# Patient Record
Sex: Female | Born: 1960 | Race: Black or African American | Hispanic: No | Marital: Single | Smoking: Current some day smoker
Health system: Southern US, Community
[De-identification: ages and names within clinical notes are randomized; demographics above are authoritative.]

---

## 1998-02-28 ENCOUNTER — Other Ambulatory Visit: Admission: RE | Admit: 1998-02-28 | Discharge: 1998-02-28 | Payer: Self-pay | Admitting: Internal Medicine

## 1999-02-14 ENCOUNTER — Encounter: Payer: Self-pay | Admitting: Emergency Medicine

## 1999-02-14 ENCOUNTER — Emergency Department (HOSPITAL_COMMUNITY): Admission: EM | Admit: 1999-02-14 | Discharge: 1999-02-14 | Payer: Self-pay | Admitting: Emergency Medicine

## 1999-04-19 ENCOUNTER — Other Ambulatory Visit: Admission: RE | Admit: 1999-04-19 | Discharge: 1999-04-19 | Payer: Self-pay | Admitting: Internal Medicine

## 2000-03-31 ENCOUNTER — Encounter: Admission: RE | Admit: 2000-03-31 | Discharge: 2000-06-29 | Payer: Self-pay | Admitting: Internal Medicine

## 2001-06-06 ENCOUNTER — Inpatient Hospital Stay (HOSPITAL_COMMUNITY): Admission: AD | Admit: 2001-06-06 | Discharge: 2001-06-10 | Payer: Self-pay | Admitting: *Deleted

## 2002-05-04 ENCOUNTER — Emergency Department (HOSPITAL_COMMUNITY): Admission: EM | Admit: 2002-05-04 | Discharge: 2002-05-04 | Payer: Self-pay | Admitting: Emergency Medicine

## 2002-05-04 ENCOUNTER — Encounter: Payer: Self-pay | Admitting: Emergency Medicine

## 2003-10-10 ENCOUNTER — Ambulatory Visit (HOSPITAL_BASED_OUTPATIENT_CLINIC_OR_DEPARTMENT_OTHER): Admission: RE | Admit: 2003-10-10 | Discharge: 2003-10-10 | Payer: Self-pay | Admitting: Family Medicine

## 2004-10-09 ENCOUNTER — Ambulatory Visit (HOSPITAL_COMMUNITY): Admission: RE | Admit: 2004-10-09 | Discharge: 2004-10-09 | Payer: Self-pay | Admitting: Obstetrics

## 2004-11-15 ENCOUNTER — Encounter: Admission: RE | Admit: 2004-11-15 | Discharge: 2004-11-15 | Payer: Self-pay | Admitting: Obstetrics

## 2006-09-08 ENCOUNTER — Inpatient Hospital Stay (HOSPITAL_COMMUNITY): Admission: AD | Admit: 2006-09-08 | Discharge: 2006-09-15 | Payer: Self-pay | Admitting: *Deleted

## 2006-09-08 ENCOUNTER — Ambulatory Visit: Payer: Self-pay | Admitting: *Deleted

## 2006-12-31 ENCOUNTER — Ambulatory Visit (HOSPITAL_COMMUNITY): Admission: RE | Admit: 2006-12-31 | Discharge: 2006-12-31 | Payer: Self-pay | Admitting: Internal Medicine

## 2007-11-06 ENCOUNTER — Emergency Department (HOSPITAL_COMMUNITY): Admission: EM | Admit: 2007-11-06 | Discharge: 2007-11-07 | Payer: Self-pay | Admitting: Emergency Medicine

## 2008-11-30 ENCOUNTER — Emergency Department (HOSPITAL_COMMUNITY): Admission: EM | Admit: 2008-11-30 | Discharge: 2008-11-30 | Payer: Self-pay | Admitting: Emergency Medicine

## 2009-07-25 ENCOUNTER — Encounter: Admission: RE | Admit: 2009-07-25 | Discharge: 2009-07-25 | Payer: Self-pay | Admitting: Internal Medicine

## 2010-03-04 ENCOUNTER — Emergency Department (HOSPITAL_COMMUNITY): Admission: EM | Admit: 2010-03-04 | Discharge: 2010-03-04 | Payer: Self-pay | Admitting: Emergency Medicine

## 2010-06-14 ENCOUNTER — Encounter: Admission: RE | Admit: 2010-06-14 | Payer: Self-pay | Source: Home / Self Care | Admitting: Internal Medicine

## 2010-07-22 ENCOUNTER — Encounter: Payer: Self-pay | Admitting: Internal Medicine

## 2010-07-22 ENCOUNTER — Encounter: Payer: Self-pay | Admitting: Obstetrics

## 2010-07-31 ENCOUNTER — Other Ambulatory Visit: Payer: Self-pay | Admitting: Internal Medicine

## 2010-07-31 DIAGNOSIS — Z1231 Encounter for screening mammogram for malignant neoplasm of breast: Secondary | ICD-10-CM

## 2010-07-31 DIAGNOSIS — Z1239 Encounter for other screening for malignant neoplasm of breast: Secondary | ICD-10-CM

## 2010-08-04 ENCOUNTER — Encounter: Payer: Self-pay | Admitting: Internal Medicine

## 2010-08-08 ENCOUNTER — Ambulatory Visit
Admission: RE | Admit: 2010-08-08 | Discharge: 2010-08-08 | Disposition: A | Discharge status: Home / Self Care | Payer: Medicaid Other | Source: Ambulatory Visit | Attending: Internal Medicine | Admitting: Internal Medicine

## 2010-08-08 DIAGNOSIS — Z1231 Encounter for screening mammogram for malignant neoplasm of breast: Secondary | ICD-10-CM

## 2010-10-08 LAB — BASIC METABOLIC PANEL
CO2: 31 mEq/L (ref 19–32)
Chloride: 102 mEq/L (ref 96–112)
Creatinine, Ser: 0.56 mg/dL (ref 0.4–1.2)
GFR calc Af Amer: >60 mL/min (ref >60)
Glucose, Bld: 89 mg/dL (ref 70–99)
Sodium: 139 mEq/L (ref 135–145)

## 2010-10-08 LAB — URINALYSIS, ROUTINE W REFLEX MICROSCOPIC
Glucose, UA: NEGATIVE mg/dL
Hgb urine dipstick: NEGATIVE
Specific Gravity, Urine: 1.017 (ref 1.005–1.030)

## 2010-10-08 LAB — POCT CARDIAC MARKERS

## 2010-10-30 ENCOUNTER — Emergency Department (HOSPITAL_COMMUNITY): Payer: Medicaid Other

## 2010-10-30 ENCOUNTER — Emergency Department (HOSPITAL_COMMUNITY)
Admission: EM | Admit: 2010-10-30 | Discharge: 2010-10-30 | Disposition: A | Discharge status: Home / Self Care | Payer: Medicaid Other | Attending: Emergency Medicine | Admitting: Emergency Medicine

## 2010-10-30 DIAGNOSIS — L0201 Cutaneous abscess of face: Secondary | ICD-10-CM | POA: Insufficient documentation

## 2010-10-30 DIAGNOSIS — R599 Enlarged lymph nodes, unspecified: Secondary | ICD-10-CM | POA: Insufficient documentation

## 2010-10-30 DIAGNOSIS — E119 Type 2 diabetes mellitus without complications: Secondary | ICD-10-CM | POA: Insufficient documentation

## 2010-10-30 DIAGNOSIS — R221 Localized swelling, mass and lump, neck: Secondary | ICD-10-CM | POA: Insufficient documentation

## 2010-10-30 DIAGNOSIS — R220 Localized swelling, mass and lump, head: Secondary | ICD-10-CM | POA: Insufficient documentation

## 2010-10-30 DIAGNOSIS — I10 Essential (primary) hypertension: Secondary | ICD-10-CM | POA: Insufficient documentation

## 2010-10-30 DIAGNOSIS — L03211 Cellulitis of face: Secondary | ICD-10-CM | POA: Insufficient documentation

## 2010-10-30 LAB — DIFFERENTIAL
Lymphs Abs: 2 10*3/uL (ref 0.7–4.0)
Monocytes Relative: 12 % (ref 3–12)
Neutro Abs: 6.1 10*3/uL (ref 1.7–7.7)
Neutrophils Relative %: 65 % (ref 43–77)

## 2010-10-30 LAB — POCT I-STAT, CHEM 8
BUN: 7 mg/dL (ref 6–23)
Calcium, Ion: 1.19 mmol/L (ref 1.12–1.32)
Creatinine, Ser: 0.9 mg/dL (ref 0.4–1.2)
Glucose, Bld: 105 mg/dL — ABNORMAL HIGH (ref 70–99)
TCO2: 34 mmol/L (ref 0–100)

## 2010-10-30 LAB — CBC
HCT: 40.5 % (ref 36.0–46.0)
Hemoglobin: 12.3 g/dL (ref 12.0–15.0)
MCH: 24.4 pg — ABNORMAL LOW (ref 26.0–34.0)
MCV: 80.2 fL (ref 78.0–100.0)
RBC: 5.05 MIL/uL (ref 3.87–5.11)

## 2010-10-30 MED ORDER — IOHEXOL 300 MG/ML  SOLN
100.0000 mL | Freq: Once | INTRAMUSCULAR | Status: AC | PRN
  Administered 2010-10-30: 100 mL via INTRAVENOUS

## 2010-11-16 NOTE — Discharge Summary (Signed)
[facility]  Patient:    Ellen Acevedo, Ellen Acevedo Visit Number: [id number] MRN: [id number]          Service Type: PSY Location: 300 [id number] 02 Attending Physician:  Jeanice Lim Dictated by:   Jeanice Lim, M.D. Admit Date:  06/06/2001 Discharge Date: 06/10/2001                             Discharge Summary  IDENTIFYING DATA:  This is a 50 year old single African-American female involuntarily admitted for psychosis.  The patient has been hospitalized at [facility] in the past and sees Dr. Dennie Maizes at [facility]  ADMISSION MEDICATIONS:  Maxzide, Risperdal, Effexor.  ALLERGIES:  TRILAFON.  PHYSICAL EXAMINATION:  Patient is 201 pounds, otherwise essentially within normal limits.   Neurologically nonfocal.  ROUTINE ADMISSION LABS:  Within normal limits, revealed a CBC with difficulty and comprehensive metabolic panel within normal limits, except for a low potassium at 2.6 on December 8, corrected at 3.2 on December 9, corrected to 3.5 on December 10.  MENTAL STATUS EXAMINATION:  Alert, middle-aged, overweight female, casually dressed, cooperative, fair eye contact.  Speech within normal limits.  Mood mildly depressed, affect flat.  Thought process goal directed.  Thought content positive for auditory hallucinations and paranoia.  No suicidal or homicidal ideation.  Cognitively intact.  Judgment and insight fair.  ADMISSION DIAGNOSES: Axis I:    Paranoid schizophrenia. Axis II:   None. Axis III:  Hypertension, borderline diabetes. Axis IV:   Problems with primary support, moderate. Axis V:    30/60.  HOSPITAL COURSE:  The patient was admitted with routine p.r.n. medications and Maxzide, Risperdal were resumed and Ativan t.i.d. p.r.n. anxiety, and Effexor XR 37.5 mg q.d. were started to target anxiety and depression.  Maxzide was adjusted, potassium was replaced with K-Dur until within normal limits. Abilify was  started at 10 mg and Effexor XR optimized.  A family meeting was held and patient reported good tolerance to medications as well as a positive response.  CONDITION ON DISCHARGE:  Improved.  Mood more stable. Affect brighter, thought processes goal directed.  Thought content negative for psychotic symptoms and dangerous ideations.  The patient reported motivation to be compliant with followup.  DISCHARGE MEDICATIONS: 1. Risperdal 2 mg q.h.s. 2. Maxzide 37.5/25 mg qam 3. K-Dur 20 mEq q.a.m. 4. Abilify 15 mg q.h.s. 5. Effexor XR 37.5 mg q.a.m. and q.3p.  DISPOSITION:  The patient had a followup appointment with [facility] on December 13 at 10 a.m.  DISCHARGE DIAGNOSES: Axis I:    Paranoid schizophrenia. Axis II:   None. Axis III:  Hypertension, borderline diabetes. Axis IV:   Problems with primary support, moderate. Axis V:    Global assessment of function on discharge was 50. Dictated by:   Jeanice Lim, M.D. Attending Physician:  Jeanice Lim DD:  07/16/01 TD:  07/16/01 Job: [id number]

## 2010-11-16 NOTE — Discharge Summary (Signed)
NAME:  Ellen Acevedo, Ellen Acevedo NO.:  [id number]   MEDICAL RECORD NO.:  [id number]          PATIENT TYPE:  IPS   LOCATION:  0403                          [facility]ILITY:  [facility]   PHYSICIAN:  Jasmine Pang, M.D. DATE OF BIRTH:  1961/04/13   DATE OF ADMISSION:  09/08/2006  DATE OF DISCHARGE:  09/15/2006                               DISCHARGE SUMMARY   IDENTIFYING INFORMATION:  A 50 year old African American female who was  admitted on an involuntary basis on September 08, 2006.   HISTORY OF PRESENT ILLNESS:  The patient was referred for admission  after developing agitation and disorganized behavior.  She reportedly  smoked a carton of cigarettes within the past week which is unlike her.  She has been tearful and believes some friend of hers has disappeared  into thin air in front of her.  She believes another friend of hers  was murdered (there was no indication this had really happened).  She  had stopped all her medications about 3 weeks ago.  She says the  minister told her she did not need them.  The [facility].  She was treated at a  group home for a period of time.  This is one of several psych  admissions.  This is the first Ellen Acevedo admission.  She was diagnosed by Dr.  Waymon Acevedo at the age of 54 with nerve problems.  She had a history of  family problems.  There was no substance abuse.  The patient has  diabetes mellitus.  She is on multiple medication (see hospital course).   SHE IS ALLERGIC POSSIBLY TO TRILAFON BY REPORT.   PHYSICAL FINDINGS:  The patient's physical exam revealed no acute  medical problems.   ADMISSION LABORATORIES:  Hemogram was within normal limits.  A routine  chemistry profile was remarkable for slightly decreased sodium of 132  and a decreased potassium of 2.8 (3.5 to 5.1).  BUN was low at 5.  A  repeat potassium done September 10, 2006 was 3.3.  Hepatic profile was  within normal limits.  TSH  was 1.003 which was within normal limits.   HOSPITAL COURSE:  Upon admission the patient was continued on her home  medications of albuterol inhaler two puffs q.6 hours p.r.n. shortness of  breath; Risperdal M-Tab 1 mg p.o. at bedtime; Claritin 10 mg p.o. daily  p.r.n.; Risperdal M-Tab 0.5 mg p.o. q.6 hours p.r.n.; Ativan 1 mg p.o.  q.6 hours p.r.n.  She was also restarted on her simvastatin 40 mg daily;  thiothixene 10 mg p.o. at bedtime; Effexor XR 37.5 mg daily x3 days then  75 mg daily; K-Dur 40 mEq now with a repeat potassium level which was up  to 3.3; aspirin 325 mg daily; Seroquel 100 mg now, then 50 mg p.o. q.  noon and 100 mg p.o. q. 6 hours p.r.n. agitation; Risperdal M-Tab 1 mg  p.o. now; Risperdal M-Tab 2 mg p.o. at bedtime; enalapril 10 mg p.o.  daily; Topamax 25 mg p.o. b.i.d.; triamterene hydrochlorothiazide  37.5/25 daily;  multivitamin daily; Klor-Con 20 mEq daily.  The patient  was also started on trazodone 50 mg p.o. at bedtime p.r.n.   On September 09, 2006, the patient was treated with Nasonex 2 sprays at  bedtime x3 days and then 1 spray each nostril at bedtime until finished.  On September 10, 2006, her Navane was increased to 5 mg p.o. q.a.m. and 10  mg p.o. at bedtime.  Effexor was increased to 75 mg p.o. daily.  On  September 11, 2006 Risperdal was increased to 3 mg p.o. at bedtime.  On  September 12, 2006 Effexor XR was increased to 112.5 mg daily.  The patient  tolerated her medications well with no significant side effects.   Upon first meeting the patient she stated I am normal now.  She states  she is Ellen Acevedo and not Ellen Acevedo.  She told me her sister had been tampering  with her medications.  She wants a Programmer, multimedia to help her,  she is gone  to [place] for me before.  She states her preacher told her to stop  taking her medications.  The patient was disoriented and confused and  had to be placed on one-to-one monitoring.  On September 12, 2006 the  patient stated she felt  better.  She had good eye contact.  She felt she  could stay with family after I offered assisted living.  She slept well.  Appetite was good.  She denied depression.  She still talks about two  Ellen Acevedo.  There was some question of disorganized thinking.  She also  talked about two Ellen Acevedo.  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  She seemed slightly more organized and lucid.  Risperdal 3 mg p.o. at  bedtime was continued as well as Navane 5 mg q.a.m. and 10 mg at  bedtime.  Effexor dose was increased to 112.5 mg daily.  On September 13, 2006 the patient was feeling better.  She stated she wanted to go to  for summer school.  Her appetite was good.  She denied suicidal  ideation.  She was having some positive auditory hallucinations, distant  sounds like conversations.  On September 14, 2006 the patient was feeling  better.  She reports she had no suicidal ideation and no hallucinations.  She had good eye contact was and was pleasant.   On September 15, 2006 mental status had improved.  The patient was friendly  with good eye contact.  She was conversant.  Speech was normal rate and  flow.  She was cooperative.  Psychomotor activity was within normal  limits.  Mood was euthymic.  Affect wide range.  No suicidal or  homicidal ideation.  No thoughts of self injurious behavior.  No  auditory or visual hallucinations.  No paranoia or delusions.  Thoughts  were logical and goal-directed.  Thought content, no predominant theme.  Cognitive was grossly back to baseline.  It was felt the patient was  well enough to be discharged today.   DISCHARGE DIAGNOSES:  AXIS I:  Schizophrenia, disorganized type.  AXIS II:  No diagnosis.  AXIS III:  Hypertension; diabetes mellitus type 2 controlled with diet;  upper respiratory infection/rhinosinusitis; dyslipidemia; obesity.  AXIS IV:  Moderate  (problems with primary support group; medical  problems; burden of psychiatric illness). AXIS V:  GAF upon  discharge was 50; GAF upon admission was 32; GAF  highest past year was 58-59.   DISCHARGE PLAN:  There were no specific activity level  or dietary  restrictions.   POST HOSPITAL CARE PLANS:  The patient will be seen at the [facility] by Dr. __________ on Tuesday March 25 at 10:30 a.m.   DISCHARGE MEDICATIONS:  1. Zocor 40 mg at 6 p.m.  2. Aspirin 325 mg daily.  3. Vasotec 10 mg daily.  4. Topamax 25 mg twice daily.  5. Maxzide 37.5/25 mg tablets, one pill in the a.m.  6. Multivitamins with minerals.  7. Potassium chloride 20 mEq daily.  8. Nasonex one spray in each nostril at bedtime.  9. Seroquel 50 mg at noon.  10.Navane 5 mg in the a.m. and 10 mg at bedtime.  11.Effexor XR 112.5 mg daily.  12.Trazodone 50 mg at bedtime if needed for insomnia.  13.Risperdal 3 mg at bedtime.      Jasmine Pang, M.D.  Electronically Signed     BHS/MEDQ  D:  10/14/2006  T:  10/14/2006  Job:  [id number]

## 2010-11-16 NOTE — H&P (Signed)
[facility]  Patient:    Ellen Acevedo, Ellen Acevedo Visit Number: [id number] MRN: [id number]          Service Type: PSY Location: [id number] 01 Attending Physician:  Denny Peon Dictated by:   Candi Leash. Orsini, N.P. Admit Date:  06/06/2001                     Psychiatric Admission Assessment  DATE OF ADMISSION:  June 11, 2001  PATIENT IDENTIFICATION:  This is a 50 year old single African-American female involuntarily admitted on June 06, 2001, for psychosis.  HISTORY OF PRESENT ILLNESS:  The patient presents with a history of psychosis. The patient was having auditory hallucinations and threatening voices and "hearing conversations about hurting her pastor and nephew."  The patient reports that she had argued with her sister over the rent yesterday.  The police were called.  Reports the third time this year and is unsure why this prompted this admission.  The patient reports she stopped taking her Effexor, that she wanted God to help her instead.  Experiencing positive olfactory hallucinations smelling fish and tomatoes.  Reports some depressive symptoms but denies any current suicidal or homicidal ideations.  The patient is unsure about threats that were mentioned toward her nephew.  She has been sleeping poorly.  Her appetite has been satisfactory.  She has had a 50 pound weight loss this year that was intentional to control her diabetes.  The patient contracts for safety.  PAST PSYCHIATRIC HISTORY:  First visit to [facility].  She was at [facility] in 1991.  Sees Dr. Octavio Acevedo at [facility], last visit was late November.  SUBSTANCE ABUSE HISTORY:  The patient smokes a half a pack a day.  She drinks alcohol on a rare basis.  Denies any substance use.  PAST MEDICAL HISTORY:  Primary care Ellen Acevedo: Dr. Delanna Acevedo in [place]. Medical problems: Hypertension, borderline diabetes.  MEDICATIONS: 1.  Maxzide 25 mg. 2. Risperdal 2 mg in the morning and 2 mg at night. 3. Effexor for one month.  DRUG ALLERGIES:  TRILAFON.  REVIEW OF SYSTEMS:  The patient denies any fever or chills.  Has had a 50 pound weight loss with no recent change in appetite.  No blurred or double vision.  No hearing loss or sinus pain.  No chest pain or palpitations. The patient smokes but no cough or orthopnea.  GI: No heartburn or change in constipation or diarrhea.  No dysuria, frequency, or hematuria.  No stiffness, swelling, or joint pain.  No itching, wound, or rash.  No numbness, tingling, or weakness.  Some history of depression and hallucinations.  No thyroid or diabetic problems.  No enlarged or tender nodes.  No environmental allergies.  PHYSICAL EXAMINATION:  VITAL SIGNS:  The patient is 201 pounds.  She is 5 feet 4.5 inches tall. Temperature 98.7, pulse 75, respirations 20, blood pressure 136/94.  GENERAL:  The patient is a 50 year old African-American female in no acute distress, well-nourished, appears her stated age.  She is fairly groomed, alert, and cooperative.  HEENT:  Head is normocephalic.  She can raise her eyebrows.  Hair is evenly distributed.  EOMs are intact bilaterally.  External ear canals are patent. Hearing is appropriate to conversation.  No sinus tenderness, no nasal discharge.  Mucosa is moist with fair dentition, no lesions were seen.  Tongue protrudes to midline without tremor.  She can clench her teeth and puff out her cheeks.  NECK:  Supple, no JVD, negative lymphadenopathy.  Thyroid is nonpalpable and nontender.  Trachea is midline.  CHEST:  Clear to auscultation, no adventitious sounds, no cough.  CARDIOVASCULAR:  Heart is regular rate and rhythm without murmurs, rubs, or gallops.  Carotid pulses are equal and adequate.  BREAST:  Exam was deferred.  ABDOMEN:  Soft, nontender abdomen, no CVA tenderness.  MUSCULOSKELETAL:  No joint swelling or deformity.  Good  range of motion. Muscle strength and tone is equal bilaterally.  SKIN:  Warm and dry with good turgor.  Nail beds are pink.  NEUROLOGIC:  Oriented x 3.  Cranial nerves are grossly intact.  Good grip strength bilaterally.  No involuntary movements.  Gait is normal.  Cerebellar function is intact with heel-to-shin and normal alternating movements. Romberg is negative.  SOCIAL HISTORY:  She is a 50 year old single African-American female with no children.  She lives with her sister, Ellen Acevedo.  She is on disability for her psychiatric illness.  Completed three and a half years of college.  She is intending to live with her other sister, Ellen Acevedo.  The patient reports no legal problems.  FAMILY HISTORY:  None.  MENTAL STATUS EXAMINATION:  She is an alert, middle-aged, overweight female, casually dressed and cooperative, fair eye contact.  Speech is normal and relevant.  Mood: The patient is mildly depressed.  Affect is flat.  Thought processes: Positive auditory and positive olfactory hallucinations, positive paranoia, no suicidal or homicidal ideations.  Cognitive: Oriented x 4. Memory is fair.  Judgment is fair.  Insight is poor to fair.  ADMISSION DIAGNOSES: Axis I:    1. Paranoid schizophrenia.            2. Rule out depression with psychotic features. Axis II:   Deferred. Axis III:  1. Hypertension.            2. Borderline diabetes. Axis IV:   Problems with primary support group, housing, and other            psychosocial problems. Axis V:    Current is 30, estimated this past year is 60.  INITIAL PLAN OF CARE:  Plan is an involuntary admission to [facility] for psychosis.  Contract for safety.  Check every 15 minutes.  The patient will remain on the locked hall for close monitoring.  We will resume her routine medications.  Will check her CBGs and hemoglobin A1c.  Will contact her family for collateral information.   Goal is to stabilize mood and thinking so the  patient can be safe and functional, to be medication  compliant, and to follow up with Dr. Octavio Acevedo after discharge.  ESTIMATED LENGTH OF STAY:  Three to five days. Dictated by:   Candi Leash. Orsini, N.P. Attending Physician:  Denny Peon DD:  06/09/01 TD:  06/09/01 Job: [id number]

## 2011-02-08 ENCOUNTER — Ambulatory Visit (HOSPITAL_BASED_OUTPATIENT_CLINIC_OR_DEPARTMENT_OTHER): Payer: Medicaid Other

## 2011-03-27 LAB — CBC
HCT: 49.5 — ABNORMAL HIGH
MCV: 86.5
Platelets: 264
WBC: 9.8

## 2011-03-27 LAB — COMPREHENSIVE METABOLIC PANEL
Albumin: 3.9
BUN: 3 — ABNORMAL LOW
Chloride: 101
Creatinine, Ser: 0.7
GFR calc non Af Amer: >60
Total Bilirubin: 0.9

## 2011-03-27 LAB — URINALYSIS, ROUTINE W REFLEX MICROSCOPIC
Bilirubin Urine: NEGATIVE
Glucose, UA: NEGATIVE
Hgb urine dipstick: NEGATIVE
Ketones, ur: 15 — AB
pH: 7.5

## 2011-03-27 LAB — DIFFERENTIAL
Basophils Absolute: 0.1
Lymphocytes Relative: 28
Monocytes Relative: 7
Neutro Abs: 6.2

## 2011-03-27 LAB — RAPID URINE DRUG SCREEN, HOSP PERFORMED
Barbiturates: NOT DETECTED
Cocaine: NOT DETECTED
Opiates: NOT DETECTED

## 2011-10-17 ENCOUNTER — Other Ambulatory Visit: Payer: Self-pay | Admitting: Internal Medicine

## 2011-10-17 DIAGNOSIS — Z1231 Encounter for screening mammogram for malignant neoplasm of breast: Secondary | ICD-10-CM

## 2011-11-20 ENCOUNTER — Ambulatory Visit: Payer: Medicaid Other

## 2011-12-20 IMAGING — CT CT NECK W/ CM
3 series · 13 of 20 positions shown, 15 images · IV contrast (agent unspecified)
Comparison: None.

CLINICAL DATA: 49-year-old female with possible abscess under Chan.
Pain and swelling.

CT NECK WITH CONTRAST
TECHNIQUE: Multidetector CT imaging of the neck was performed with
intravenous contrast.
Contrast: 100 ml [id number].

[Series 2: neck rtn st · axial · 0.39mm/px · z∈[-199,-55]mm · 5 of 73 slices shown]
[im 13/73  bone]
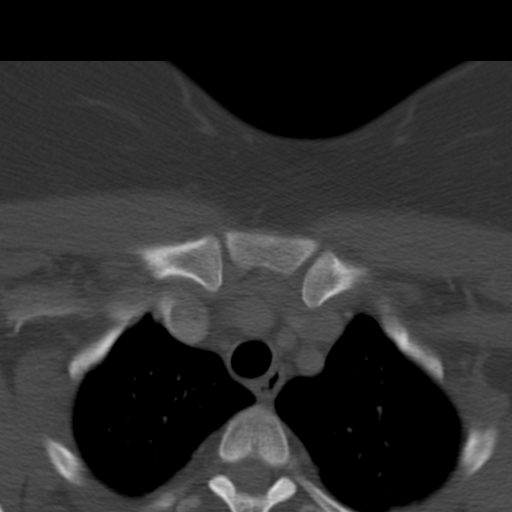
[im 25/73  bone]
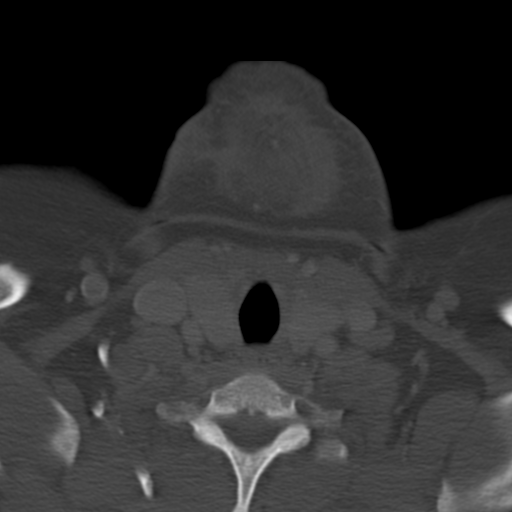
[im 37/73  bone]
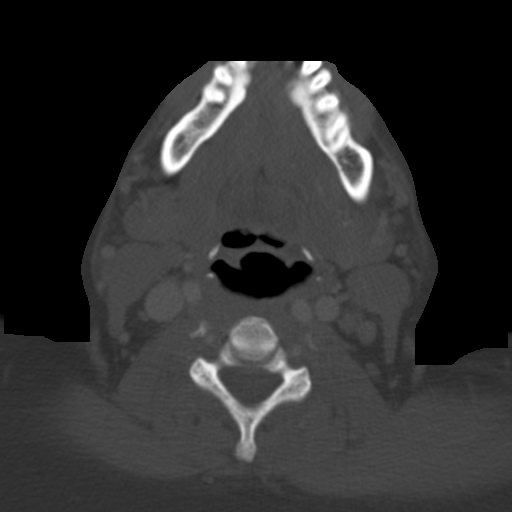
[im 49/73  bone]
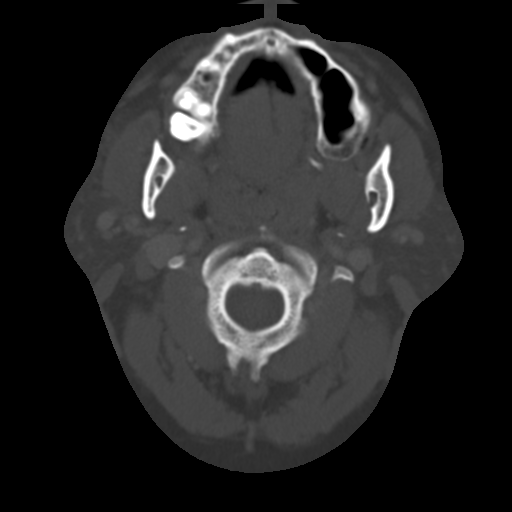
[im 61/73  bone]
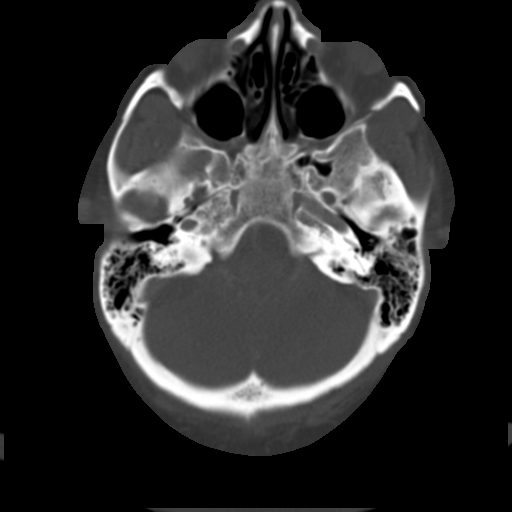

[Series 602: <mpr thick range> · coronal · 0.43mm/px · 3 of 58 slices shown]
[im 19/58  bone]
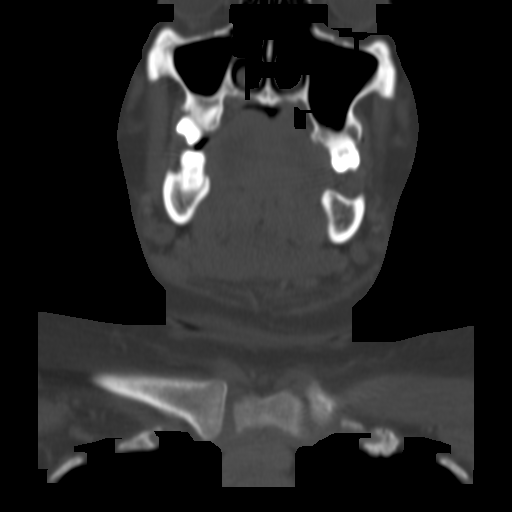
[im 26/58  bone]
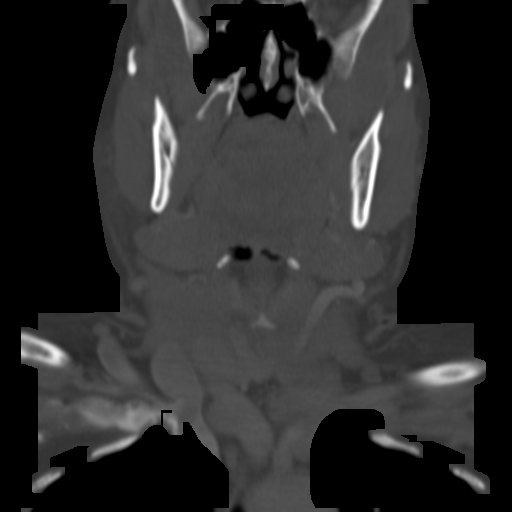
[im 33/58  bone]
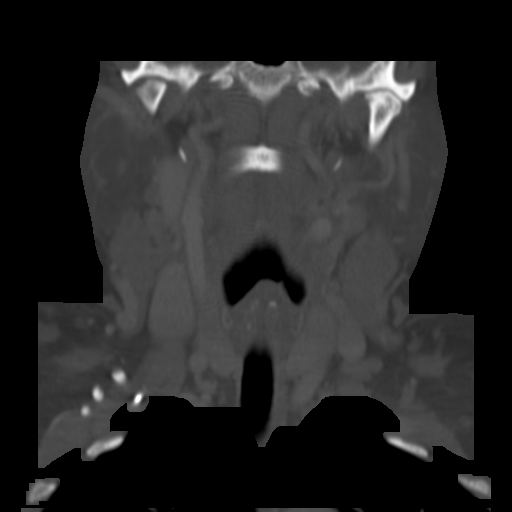

[Series 603: <mpr thick range(1)> · axial · 0.43mm/px · z∈[-261,-134]mm · 5 of 72 slices shown, 7 images]
[im 12/72  soft-tissue]
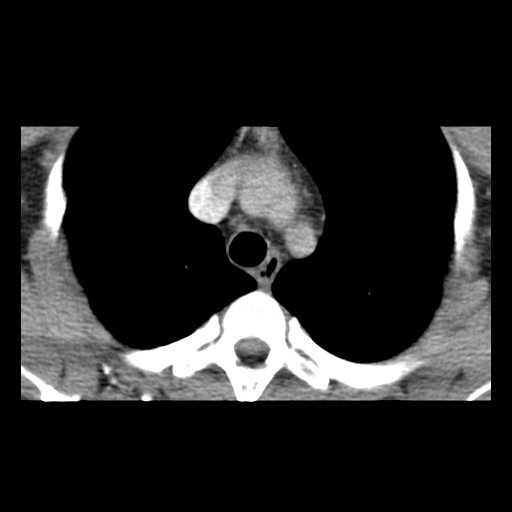
[im 12/72  bone]
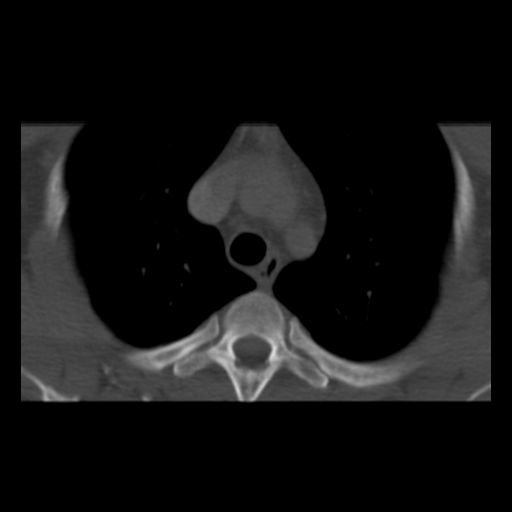
[im 24/72  bone]
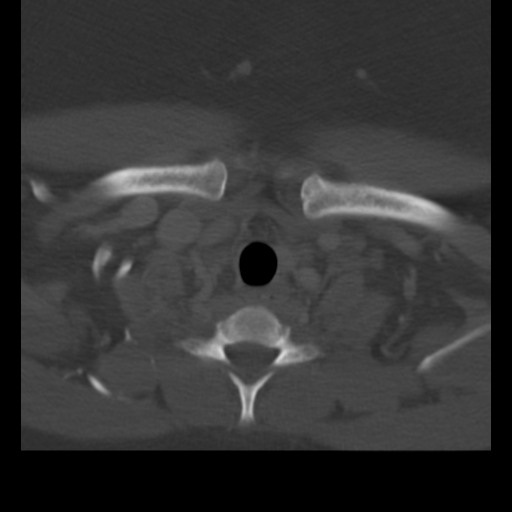
[im 36/72  bone]
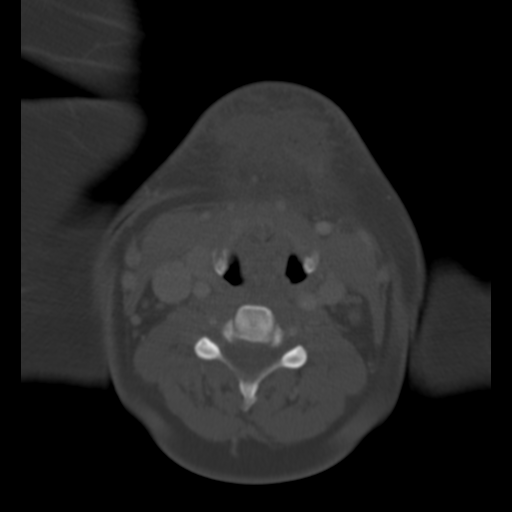
[im 48/72  bone]
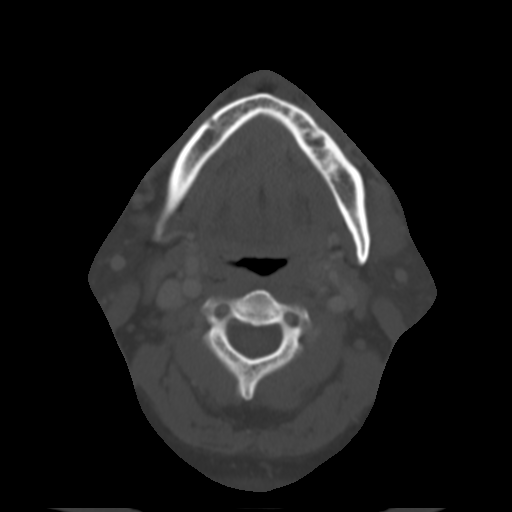
[im 60/72  soft-tissue]
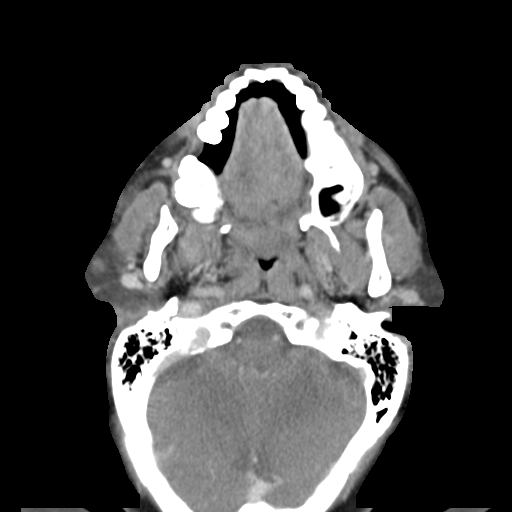
[im 60/72  bone]
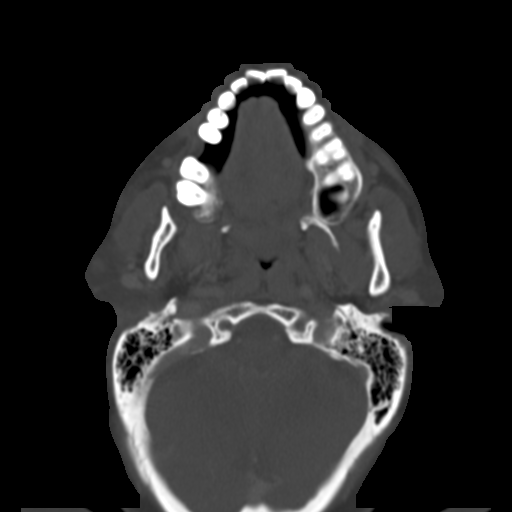

[13 of 20 positions shown; findings below may reference images not displayed]

FINDINGS: In the submental region there is an indistinct area of
fluid density and small locules of gas encompassing 46 x 29 x 15 mm
(transverse by AP by CC).  This does not appear encapsulated or
organized.  There is surrounding soft tissue stranding.  The
mandible appears intact and unaffected.  The.  The sublingual space
and submandibular glands appear unaffected.  There are small level
I a and level I be lymph nodes nearby.

Parapharyngeal, retropharyngeal spaces, thyroid, pharyngeal mucosal
spaces and parotid glands are within normal limits.  Major vascular
structures are patent. Visualized paranasal sinuses and mastoids
are clear.  No acute osseous abnormality identified.  Small
pneumatocyst are seen in the bilateral upper lobes.  Upper lungs
otherwise are clear.  Visualized brain parenchyma and orbits are
within normal limits.
IMPRESSION: 1.  Submental phlegmon on with fluid and small locules of gas may
air may not be drainable at this time. This encompasses 46 x 29 x
15 mm.
2.  No involvement of the mandible, or deep soft tissue spaces of
the neck.  Mild reactive lymphadenopathy.

## 2011-12-27 ENCOUNTER — Ambulatory Visit: Payer: Medicaid Other

## 2012-01-24 ENCOUNTER — Ambulatory Visit: Payer: Medicaid Other

## 2012-08-25 ENCOUNTER — Other Ambulatory Visit: Payer: Self-pay | Admitting: Internal Medicine

## 2012-08-25 ENCOUNTER — Other Ambulatory Visit: Payer: Self-pay

## 2012-08-25 DIAGNOSIS — Z1231 Encounter for screening mammogram for malignant neoplasm of breast: Secondary | ICD-10-CM

## 2012-09-16 ENCOUNTER — Ambulatory Visit (HOSPITAL_BASED_OUTPATIENT_CLINIC_OR_DEPARTMENT_OTHER): Payer: Medicaid Other

## 2012-09-23 ENCOUNTER — Ambulatory Visit: Payer: Medicaid Other

## 2017-03-20 ENCOUNTER — Other Ambulatory Visit: Payer: Self-pay | Admitting: Internal Medicine

## 2017-03-20 DIAGNOSIS — E2839 Other primary ovarian failure: Secondary | ICD-10-CM

## 2017-03-20 DIAGNOSIS — Z1231 Encounter for screening mammogram for malignant neoplasm of breast: Secondary | ICD-10-CM

## 2017-04-08 ENCOUNTER — Ambulatory Visit: Payer: Self-pay

## 2017-04-08 ENCOUNTER — Other Ambulatory Visit: Payer: Self-pay

## 2017-04-30 ENCOUNTER — Ambulatory Visit
Admission: RE | Admit: 2017-04-30 | Discharge: 2017-04-30 | Disposition: A | Discharge status: Home / Self Care | Payer: Medicaid Other | Source: Ambulatory Visit | Attending: Internal Medicine | Admitting: Internal Medicine

## 2017-04-30 ENCOUNTER — Encounter: Payer: Self-pay | Admitting: Radiology

## 2017-04-30 DIAGNOSIS — Z1231 Encounter for screening mammogram for malignant neoplasm of breast: Secondary | ICD-10-CM

## 2017-04-30 DIAGNOSIS — E2839 Other primary ovarian failure: Secondary | ICD-10-CM

## 2021-04-12 ENCOUNTER — Other Ambulatory Visit: Payer: Self-pay | Admitting: Internal Medicine

## 2021-04-12 DIAGNOSIS — E2839 Other primary ovarian failure: Secondary | ICD-10-CM

## 2021-05-30 ENCOUNTER — Other Ambulatory Visit: Payer: Self-pay | Admitting: Internal Medicine

## 2021-05-30 DIAGNOSIS — Z1231 Encounter for screening mammogram for malignant neoplasm of breast: Secondary | ICD-10-CM

## 2022-04-13 ENCOUNTER — Inpatient Hospital Stay (HOSPITAL_COMMUNITY)
Admission: EM | Admit: 2022-04-13 | Discharge: 2022-04-27 | DRG: 291 | Disposition: A | Discharge status: Skilled Nursing Facility | Payer: Medicaid Other | Attending: Student | Admitting: Student

## 2022-04-13 ENCOUNTER — Emergency Department (HOSPITAL_COMMUNITY): Payer: Medicaid Other

## 2022-04-13 ENCOUNTER — Other Ambulatory Visit: Payer: Self-pay

## 2022-04-13 ENCOUNTER — Encounter (HOSPITAL_COMMUNITY): Payer: Self-pay | Admitting: Emergency Medicine

## 2022-04-13 DIAGNOSIS — F209 Schizophrenia, unspecified: Secondary | ICD-10-CM

## 2022-04-13 DIAGNOSIS — I5033 Acute on chronic diastolic (congestive) heart failure: Secondary | ICD-10-CM | POA: Insufficient documentation

## 2022-04-13 DIAGNOSIS — A045 Campylobacter enteritis: Secondary | ICD-10-CM | POA: Diagnosis present

## 2022-04-13 DIAGNOSIS — I4719 Other supraventricular tachycardia: Secondary | ICD-10-CM | POA: Diagnosis present

## 2022-04-13 DIAGNOSIS — I3139 Other pericardial effusion (noninflammatory): Secondary | ICD-10-CM | POA: Diagnosis present

## 2022-04-13 DIAGNOSIS — E66813 Obesity, class 3: Secondary | ICD-10-CM

## 2022-04-13 DIAGNOSIS — R5381 Other malaise: Secondary | ICD-10-CM | POA: Diagnosis present

## 2022-04-13 DIAGNOSIS — G9341 Metabolic encephalopathy: Secondary | ICD-10-CM | POA: Diagnosis present

## 2022-04-13 DIAGNOSIS — Z7984 Long term (current) use of oral hypoglycemic drugs: Secondary | ICD-10-CM

## 2022-04-13 DIAGNOSIS — E662 Morbid (severe) obesity with alveolar hypoventilation: Secondary | ICD-10-CM | POA: Diagnosis present

## 2022-04-13 DIAGNOSIS — Z9189 Other specified personal risk factors, not elsewhere classified: Secondary | ICD-10-CM

## 2022-04-13 DIAGNOSIS — J189 Pneumonia, unspecified organism: Secondary | ICD-10-CM

## 2022-04-13 DIAGNOSIS — A09 Infectious gastroenteritis and colitis, unspecified: Secondary | ICD-10-CM | POA: Insufficient documentation

## 2022-04-13 DIAGNOSIS — Z87891 Personal history of nicotine dependence: Secondary | ICD-10-CM

## 2022-04-13 DIAGNOSIS — A419 Sepsis, unspecified organism: Principal | ICD-10-CM

## 2022-04-13 DIAGNOSIS — L03119 Cellulitis of unspecified part of limb: Secondary | ICD-10-CM

## 2022-04-13 DIAGNOSIS — Z789 Other specified health status: Secondary | ICD-10-CM

## 2022-04-13 DIAGNOSIS — E119 Type 2 diabetes mellitus without complications: Secondary | ICD-10-CM

## 2022-04-13 DIAGNOSIS — F40240 Claustrophobia: Secondary | ICD-10-CM | POA: Diagnosis present

## 2022-04-13 DIAGNOSIS — I509 Heart failure, unspecified: Secondary | ICD-10-CM

## 2022-04-13 DIAGNOSIS — R000 Tachycardia, unspecified: Secondary | ICD-10-CM

## 2022-04-13 DIAGNOSIS — Z79899 Other long term (current) drug therapy: Secondary | ICD-10-CM

## 2022-04-13 DIAGNOSIS — Z6841 Body Mass Index (BMI) 40.0 and over, adult: Secondary | ICD-10-CM

## 2022-04-13 DIAGNOSIS — E785 Hyperlipidemia, unspecified: Secondary | ICD-10-CM

## 2022-04-13 DIAGNOSIS — Z20822 Contact with and (suspected) exposure to covid-19: Secondary | ICD-10-CM | POA: Diagnosis present

## 2022-04-13 DIAGNOSIS — I2510 Atherosclerotic heart disease of native coronary artery without angina pectoris: Secondary | ICD-10-CM

## 2022-04-13 DIAGNOSIS — E876 Hypokalemia: Secondary | ICD-10-CM | POA: Diagnosis present

## 2022-04-13 DIAGNOSIS — E873 Alkalosis: Secondary | ICD-10-CM | POA: Diagnosis present

## 2022-04-13 DIAGNOSIS — K760 Fatty (change of) liver, not elsewhere classified: Secondary | ICD-10-CM | POA: Diagnosis present

## 2022-04-13 DIAGNOSIS — J9622 Acute and chronic respiratory failure with hypercapnia: Secondary | ICD-10-CM | POA: Diagnosis present

## 2022-04-13 DIAGNOSIS — J449 Chronic obstructive pulmonary disease, unspecified: Secondary | ICD-10-CM | POA: Diagnosis present

## 2022-04-13 DIAGNOSIS — I69354 Hemiplegia and hemiparesis following cerebral infarction affecting left non-dominant side: Secondary | ICD-10-CM

## 2022-04-13 DIAGNOSIS — R627 Adult failure to thrive: Secondary | ICD-10-CM | POA: Diagnosis present

## 2022-04-13 DIAGNOSIS — Z91199 Patient's noncompliance with other medical treatment and regimen due to unspecified reason: Secondary | ICD-10-CM

## 2022-04-13 DIAGNOSIS — I110 Hypertensive heart disease with heart failure: Principal | ICD-10-CM | POA: Diagnosis present

## 2022-04-13 DIAGNOSIS — I878 Other specified disorders of veins: Secondary | ICD-10-CM | POA: Diagnosis present

## 2022-04-13 DIAGNOSIS — J9621 Acute and chronic respiratory failure with hypoxia: Secondary | ICD-10-CM | POA: Diagnosis present

## 2022-04-13 DIAGNOSIS — Z5919 Other inadequate housing: Secondary | ICD-10-CM

## 2022-04-13 DIAGNOSIS — J9601 Acute respiratory failure with hypoxia: Secondary | ICD-10-CM | POA: Diagnosis present

## 2022-04-13 DIAGNOSIS — R197 Diarrhea, unspecified: Secondary | ICD-10-CM

## 2022-04-13 DIAGNOSIS — I471 Supraventricular tachycardia, unspecified: Secondary | ICD-10-CM

## 2022-04-13 DIAGNOSIS — I10 Essential (primary) hypertension: Secondary | ICD-10-CM

## 2022-04-13 LAB — CBC WITH DIFFERENTIAL/PLATELET
Abs Immature Granulocytes: 0.05 10*3/uL (ref 0.00–0.07)
Basophils Absolute: 0 10*3/uL (ref 0.0–0.1)
Basophils Relative: 0 %
Eosinophils Absolute: 0 10*3/uL (ref 0.0–0.5)
Eosinophils Relative: 0 %
HCT: 45.8 % (ref 36.0–46.0)
Hemoglobin: 13 g/dL (ref 12.0–15.0)
Immature Granulocytes: 1 %
Lymphocytes Relative: 19 %
Lymphs Abs: 1.4 10*3/uL (ref 0.7–4.0)
MCH: 24.1 pg — ABNORMAL LOW (ref 26.0–34.0)
MCHC: 28.4 g/dL — ABNORMAL LOW (ref 30.0–36.0)
MCV: 85 fL (ref 80.0–100.0)
Monocytes Absolute: 1.8 10*3/uL — ABNORMAL HIGH (ref 0.1–1.0)
Monocytes Relative: 25 %
Neutro Abs: 3.9 10*3/uL (ref 1.7–7.7)
Neutrophils Relative %: 55 %
Platelets: 185 10*3/uL (ref 150–400)
RBC: 5.39 MIL/uL — ABNORMAL HIGH (ref 3.87–5.11)
RDW: 19.4 % — ABNORMAL HIGH (ref 11.5–15.5)
WBC: 7.2 10*3/uL (ref 4.0–10.5)
nRBC: 0.4 % — ABNORMAL HIGH (ref 0.0–0.2)

## 2022-04-13 LAB — COMPREHENSIVE METABOLIC PANEL
ALT: 11 U/L (ref 0–44)
AST: 16 U/L (ref 15–41)
Albumin: 3.1 g/dL — ABNORMAL LOW (ref 3.5–5.0)
Alkaline Phosphatase: 97 U/L (ref 38–126)
Anion gap: 7 (ref 5–15)
BUN: 6 mg/dL — ABNORMAL LOW (ref 8–23)
CO2: 35 mmol/L — ABNORMAL HIGH (ref 22–32)
Calcium: 8.9 mg/dL (ref 8.9–10.3)
Chloride: 96 mmol/L — ABNORMAL LOW (ref 98–111)
Creatinine, Ser: 0.58 mg/dL (ref 0.44–1.00)
GFR, Estimated: >60 mL/min (ref >60)
Glucose, Bld: 79 mg/dL (ref 70–99)
Potassium: 4.6 mmol/L (ref 3.5–5.1)
Sodium: 138 mmol/L (ref 135–145)
Total Bilirubin: 1.3 mg/dL — ABNORMAL HIGH (ref 0.3–1.2)
Total Protein: 7.2 g/dL (ref 6.5–8.1)

## 2022-04-13 LAB — BLOOD GAS, VENOUS
Acid-Base Excess: 14.7 mmol/L — ABNORMAL HIGH (ref 0.0–2.0)
Bicarbonate: 44.2 mmol/L — ABNORMAL HIGH (ref 20.0–28.0)
O2 Saturation: 81.7 %
Patient temperature: 37
pCO2, Ven: 80 mmHg (ref 44–60)
pH, Ven: 7.35 (ref 7.25–7.43)
pO2, Ven: 54 mmHg — ABNORMAL HIGH (ref 32–45)

## 2022-04-13 LAB — BRAIN NATRIURETIC PEPTIDE: B Natriuretic Peptide: 973.3 pg/mL — ABNORMAL HIGH (ref 0.0–100.0)

## 2022-04-13 LAB — RESP PANEL BY RT-PCR (FLU A&B, COVID) ARPGX2
Influenza A by PCR: NEGATIVE
Influenza B by PCR: NEGATIVE
SARS Coronavirus 2 by RT PCR: NEGATIVE

## 2022-04-13 LAB — CBG MONITORING, ED: Glucose-Capillary: 94 mg/dL (ref 70–99)

## 2022-04-13 LAB — TROPONIN I (HIGH SENSITIVITY)
Troponin I (High Sensitivity): 19 ng/L — ABNORMAL HIGH (ref <18)
Troponin I (High Sensitivity): 18 ng/L — ABNORMAL HIGH (ref <18)

## 2022-04-13 LAB — LACTIC ACID, PLASMA: Lactic Acid, Venous: 2.2 mmol/L (ref 0.5–1.9)

## 2022-04-13 MED ORDER — LACTATED RINGERS IV SOLN: INTRAVENOUS | Status: DC

## 2022-04-13 MED ORDER — SODIUM CHLORIDE 0.9 % IV BOLUS
1000.0000 mL | Freq: Once | INTRAVENOUS | Status: AC
  Administered 2022-04-13: 1000 mL via INTRAVENOUS

## 2022-04-13 MED ORDER — SODIUM CHLORIDE 0.9 % IV BOLUS: 500.0000 mL | Freq: Once | INTRAVENOUS | Status: DC

## 2022-04-13 MED ORDER — METRONIDAZOLE 500 MG/100ML IV SOLN
500.0000 mg | Freq: Once | INTRAVENOUS | Status: AC
  Administered 2022-04-13: 500 mg via INTRAVENOUS
  Filled 2022-04-13: qty 100

## 2022-04-13 MED ORDER — IPRATROPIUM-ALBUTEROL 0.5-2.5 (3) MG/3ML IN SOLN
3.0000 mL | Freq: Once | RESPIRATORY_TRACT | Status: DC
  Filled 2022-04-13: qty 3

## 2022-04-13 MED ORDER — SODIUM CHLORIDE 0.9 % IV SOLN
2.0000 g | Freq: Once | INTRAVENOUS | Status: AC
  Administered 2022-04-13: 2 g via INTRAVENOUS
  Filled 2022-04-13: qty 12.5

## 2022-04-13 MED ORDER — VANCOMYCIN HCL IN DEXTROSE 1-5 GM/200ML-% IV SOLN
1000.0000 mg | Freq: Once | INTRAVENOUS | Status: AC
  Administered 2022-04-13: 1000 mg via INTRAVENOUS
  Filled 2022-04-13: qty 200

## 2022-04-13 MED ORDER — IOHEXOL 350 MG/ML SOLN
100.0000 mL | Freq: Once | INTRAVENOUS | Status: AC | PRN
  Administered 2022-04-14: 100 mL via INTRAVENOUS

## 2022-04-13 NOTE — Progress Notes (Signed)
A consult was received from an ED physician for vancomycin and cefepime per pharmacy dosing (for an indication other than meningitis). The patient's profile has been reviewed for ht/wt/allergies/indication/available labs. A one time order has been placed for the above antibiotics.  Further antibiotics/pharmacy consults should be ordered by admitting physician if indicated.                       Reuel Boom, PharmD, BCPS [phone] 04/13/2022, 5:36 PM

## 2022-04-13 NOTE — ED Triage Notes (Signed)
Pt BIBA from home. PD had been called for a wellness check. Pt found in home w/bedbugs and roaches. C/o diarrhea for 1x month. Supposed to have aide.  SPO2 upon EMS arrival- 77% When on 4L 100%  BP: 176/100  HR: 105 CBG: 142

## 2022-04-13 NOTE — Progress Notes (Signed)
Elink is following this code sepsis ?

## 2022-04-13 NOTE — ED Notes (Signed)
Pt was given a bed bath by 2 RN's and one tech. Pt was changed into fresh linen and given new sheets for the bed. Purwick placed.

## 2022-04-13 NOTE — ED Provider Notes (Signed)
[facility]-EMERGENCY DEPT Provider Note   CSN: [id number] Arrival date & time: 04/13/22  1625     History {Add pertinent medical, surgical, social history, OB history to HPI:1} Chief Complaint  Patient presents with   Diarrhea   Failure To Thrive    Ellen Acevedo is a 61 y.o. female.   Diarrhea   62 year old female presents emergency department with complaints of diarrhea x3 weeks, confusion, dysuria, cough, shortness of breath.  Patient states she lives by himself in apartment complex and has not been taking care of herself for some time.  She is accompanied by sister and father who are also concerned.  Patient states that for the past 3 weeks, she has had persistent nonbloody/none melena appearing diarrhea after eating chicken noodle soup.  Denies recent antibiotic use or travel but states that she has been unable to have a solid bowel movement since onset.  She also notes diffuse swelling of the lower extremities upper abdomen.  She states that she has been unable to move much for the past 3 weeks due to constant diarrhea, swelling of lower extremities and current weight.  She also reports shortness of breath and increased cough over the past few weeks.  She also reports some feelings of dysuria without hematuria.  Denies fever, chills, night sweats, chest pain, abdominal pain, nausea, vomiting.  Family is concerned that patient is unable to live by herself anymore.  Patient that she has no at home oxygen requirement.  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  Home Medications Prior to Admission medications   Not on File      Allergies    Trilafon [perphenazine]    Review of Systems   Review of Systems  Gastrointestinal:  Positive for diarrhea.  All other systems reviewed  and are negative.   Physical Exam Updated Vital Signs BP (!) 156/94   Pulse (!) 105   Temp 98.1 F (36.7 C) (Oral)   Resp 17   Ht 5\' 4"  (1.626 m)   Wt 108.9 kg   SpO2 100%   BMI 41.20 kg/m  Physical Exam Vitals and nursing note reviewed.  Constitutional:      General: She is not in acute distress.    Appearance: She is well-developed.     Comments: Patient on 4 L nasal cannula in room with oxygen saturation of 100%.  Oxygen was decreased to 0 L on exam and she quickly became hypoxic saturating 82 before it is increased back to 3 L where she remained around 94/95%. Patient tachypneic and tachycardic.  HENT:     Head: Normocephalic and atraumatic.  Eyes:     Extraocular Movements: Extraocular movements intact.     Conjunctiva/sclera: Conjunctivae normal.     Pupils: Pupils are equal, round, and reactive to light.  Cardiovascular:     Rate and Rhythm: Normal rate and regular rhythm.     Heart sounds: No murmur heard. Pulmonary:     Effort: Pulmonary effort is normal. No respiratory distress.     Breath sounds: Wheezing present.     Comments: Diffuse lower wheeze upon auscultation difficult to assess minor breath sounds or heart sounds given current wheeze. Abdominal:     Palpations: Abdomen is soft.     Tenderness: There is no abdominal tenderness.  Comments: Patient obese abdominal exam difficult to perform precisely.  Patient elicits no abdominal tenderness when palpation was performed in all 4 quadrants.  Erythematous skin changes noted on patient's lower abdomen and in fold of pannus.  Musculoskeletal:        General: No swelling.     Cervical back: Normal range of motion and neck supple. No rigidity or tenderness.     Right lower leg: Edema present.     Left lower leg: Edema present.     Comments: Patient has erythematous skin changes to bilateral lower extremities in their entirety.  Area is warm to the touch.  Skin taut from swelling but compartments still soft and  supple.  No obvious breaks in skin noted in lower extremities.  Due to 3+ pitting edema noted bilateral lower extremities.  Pulses faintly palpable bilateral dorsalis pedis Patient had erythematous/ecchymotic skin changes in the sacral area with no obvious break in skin.  Skin:    General: Skin is warm and dry.     Capillary Refill: Capillary refill takes less than 2 seconds.  Neurological:     Mental Status: She is alert.     Comments: Alert and oriented to self, place, time and event.   Speech is fluent, clear without dysarthria or dysphasia.   Strength slightly decreased on left when compared to right but only minimally. Sensation intact in upper/lower extremities   Gait not assessed due to patient's body habitus and current health status.   No pronator drift.  CN I not tested  CN II grossly intact visual fields bilaterally. Did not visualize posterior eye.  CN III, IV, VI PERRLA and EOMs intact bilaterally  CN V Intact sensation to sharp and light touch to the face  CN VII facial movements symmetric  CN VIII not tested  CN IX, X no uvula deviation, symmetric rise of soft palate  CN XI 5/5 SCM and trapezius strength bilaterally  CN XII Midline tongue protrusion, symmetric L/R movements     Psychiatric:        Mood and Affect: Mood normal.     ED Results / Procedures / Treatments   Labs (all labs ordered are listed, but only abnormal results are displayed) Labs Reviewed  CBG MONITORING, ED    EKG None  Radiology No results found.  Procedures Procedures  {Document cardiac monitor, telemetry assessment procedure when appropriate:1}  Medications Ordered in ED Medications  ipratropium-albuterol (DUONEB) 0.5-2.5 (3) MG/3ML nebulizer solution 3 mL (has no administration in time range)  sodium chloride 0.9 % bolus 500 mL (has no administration in time range)  lactated ringers infusion (has no administration in time range)  ceFEPIme (MAXIPIME) 2 g in sodium chloride  0.9 % 100 mL IVPB (has no administration in time range)  metroNIDAZOLE (FLAGYL) IVPB 500 mg (has no administration in time range)  vancomycin (VANCOCIN) IVPB 1000 mg/200 mL premix (has no administration in time range)    ED Course/ Medical Decision Making/ A&P Clinical Course as of 04/13/22 2134  Sat Apr 13, 2022  2133 Blood gas, venous(!!) Given patient's PCO2 of 80 with PO2 of 54, BiPAP was ordered. [CR]    Clinical Course User Index [CR] Wilnette Kales, PA                           Medical Decision Making Amount and/or Complexity of Data Reviewed Labs: ordered. Decision-making details documented in ED Course. Radiology: ordered.  Risk Prescription  drug management.   This patient presents to the ED for concern of altered mental status, shortness of breath, this involves an extensive number of treatment options, and is a complaint that carries with it a high risk of complications and morbidity.  The differential diagnosis includes sepsis, pneumonia, COVID, CHF exacerbation, ACS, cellulitis, urosepsis, CVA   Co morbidities that complicate the patient evaluation  See HPI   Additional history obtained:  Additional history obtained from EMR External records from outside source obtained and reviewed including hospital records   Lab Tests:  I Ordered, and personally interpreted labs.  The pertinent results include: Initial troponin of 19 with delta 18; sinus tachycardia with incomplete right bundle branch block on EKG; doubt ACS.  No leukocytosis noted.  No evidence of anemia.  Platelets within normal range.  Decrease in increase in bicarb 35 decrease in likely related to GI loss.  No transaminitis were noted.  Renal function within normal limits.  VBG significant for normal pH with PCO2 of 80 and PO2 of 54 with bicarb of 44.2; patient was placed on BiPAP.  BNP pending.  UA and urine culture pending.  GI panel pending.  Lactic acid pending.  Blood cultures pending.  Respiratory  viral panel negative.   Imaging Studies ordered:  I ordered imaging studies including chest x-ray, CT angio chest, CT and pelvis, CT head I independently visualized and interpreted imaging which showed CT chest: Cardiomegaly with pulmonary vascular congestion.  Hazy opacities in the mid to lower right lung field with possible layering pleural effusion versus edema or infiltrate. I agree with the radiologist interpretation  Cardiac Monitoring: / EKG:  The patient was maintained on a cardiac monitor.  I personally viewed and interpreted the cardiac monitored which showed an underlying rhythm of: Sinus tachycardia with possible right bundle branch block.   Consultations Obtained:  N/a   Problem List / ED Course / Critical interventions / Medication management  I ordered medication including ***  for ***  Reevaluation of the patient after these medicines showed that the patient {resolved/improved/worsened:23923::"improved"} I have reviewed the patients home medicines and have made adjustments as needed   Social Determinants of Health:  ***   Test / Admission - Considered:  Vitals signs significant for ***. Otherwise within normal range and stable throughout visit. Laboratory/imaging studies significant for: *** *** Treatment plan were discussed at length with patient and they knowledge understanding was agreeable to said plan.  Appropriate consultations were made as described in the ED course.  Patient was stable upon admission to the hospital.   {Document critical care time when appropriate:1} {Document review of labs and clinical decision tools ie heart score, Chads2Vasc2 etc:1}  {Document your independent review of radiology images, and any outside records:1} {Document your discussion with family members, caretakers, and with consultants:1} {Document social determinants of health affecting pt's care:1} {Document your decision making why or why not admission, treatments were  needed:1} Final Clinical Impression(s) / ED Diagnoses Final diagnoses:  None    Rx / DC Orders ED Discharge Orders     None

## 2022-04-14 ENCOUNTER — Encounter (HOSPITAL_COMMUNITY): Payer: Self-pay | Admitting: Internal Medicine

## 2022-04-14 ENCOUNTER — Other Ambulatory Visit (HOSPITAL_COMMUNITY): Payer: Medicaid Other

## 2022-04-14 ENCOUNTER — Inpatient Hospital Stay (HOSPITAL_COMMUNITY): Payer: Medicaid Other

## 2022-04-14 DIAGNOSIS — A09 Infectious gastroenteritis and colitis, unspecified: Secondary | ICD-10-CM | POA: Insufficient documentation

## 2022-04-14 DIAGNOSIS — G9341 Metabolic encephalopathy: Secondary | ICD-10-CM | POA: Diagnosis present

## 2022-04-14 DIAGNOSIS — E876 Hypokalemia: Secondary | ICD-10-CM | POA: Diagnosis present

## 2022-04-14 DIAGNOSIS — I5021 Acute systolic (congestive) heart failure: Secondary | ICD-10-CM | POA: Diagnosis not present

## 2022-04-14 DIAGNOSIS — I110 Hypertensive heart disease with heart failure: Secondary | ICD-10-CM | POA: Diagnosis present

## 2022-04-14 DIAGNOSIS — I2510 Atherosclerotic heart disease of native coronary artery without angina pectoris: Secondary | ICD-10-CM

## 2022-04-14 DIAGNOSIS — E873 Alkalosis: Secondary | ICD-10-CM | POA: Diagnosis present

## 2022-04-14 DIAGNOSIS — J9621 Acute and chronic respiratory failure with hypoxia: Secondary | ICD-10-CM | POA: Diagnosis present

## 2022-04-14 DIAGNOSIS — I5031 Acute diastolic (congestive) heart failure: Secondary | ICD-10-CM | POA: Diagnosis not present

## 2022-04-14 DIAGNOSIS — F40240 Claustrophobia: Secondary | ICD-10-CM | POA: Diagnosis present

## 2022-04-14 DIAGNOSIS — Z6841 Body Mass Index (BMI) 40.0 and over, adult: Secondary | ICD-10-CM | POA: Diagnosis not present

## 2022-04-14 DIAGNOSIS — R0602 Shortness of breath: Secondary | ICD-10-CM | POA: Diagnosis present

## 2022-04-14 DIAGNOSIS — J9622 Acute and chronic respiratory failure with hypercapnia: Secondary | ICD-10-CM | POA: Diagnosis present

## 2022-04-14 DIAGNOSIS — I69354 Hemiplegia and hemiparesis following cerebral infarction affecting left non-dominant side: Secondary | ICD-10-CM | POA: Diagnosis not present

## 2022-04-14 DIAGNOSIS — R000 Tachycardia, unspecified: Secondary | ICD-10-CM | POA: Diagnosis not present

## 2022-04-14 DIAGNOSIS — I5033 Acute on chronic diastolic (congestive) heart failure: Secondary | ICD-10-CM | POA: Diagnosis present

## 2022-04-14 DIAGNOSIS — K760 Fatty (change of) liver, not elsewhere classified: Secondary | ICD-10-CM | POA: Diagnosis present

## 2022-04-14 DIAGNOSIS — F209 Schizophrenia, unspecified: Secondary | ICD-10-CM

## 2022-04-14 DIAGNOSIS — Z87891 Personal history of nicotine dependence: Secondary | ICD-10-CM | POA: Diagnosis not present

## 2022-04-14 DIAGNOSIS — E785 Hyperlipidemia, unspecified: Secondary | ICD-10-CM

## 2022-04-14 DIAGNOSIS — J449 Chronic obstructive pulmonary disease, unspecified: Secondary | ICD-10-CM | POA: Diagnosis present

## 2022-04-14 DIAGNOSIS — J9601 Acute respiratory failure with hypoxia: Secondary | ICD-10-CM | POA: Diagnosis not present

## 2022-04-14 DIAGNOSIS — Z7984 Long term (current) use of oral hypoglycemic drugs: Secondary | ICD-10-CM | POA: Diagnosis not present

## 2022-04-14 DIAGNOSIS — I3139 Other pericardial effusion (noninflammatory): Secondary | ICD-10-CM | POA: Diagnosis present

## 2022-04-14 DIAGNOSIS — Z5919 Other inadequate housing: Secondary | ICD-10-CM | POA: Diagnosis not present

## 2022-04-14 DIAGNOSIS — E119 Type 2 diabetes mellitus without complications: Secondary | ICD-10-CM

## 2022-04-14 DIAGNOSIS — I509 Heart failure, unspecified: Secondary | ICD-10-CM

## 2022-04-14 DIAGNOSIS — I10 Essential (primary) hypertension: Secondary | ICD-10-CM | POA: Diagnosis not present

## 2022-04-14 DIAGNOSIS — E662 Morbid (severe) obesity with alveolar hypoventilation: Secondary | ICD-10-CM | POA: Diagnosis present

## 2022-04-14 DIAGNOSIS — Z20822 Contact with and (suspected) exposure to covid-19: Secondary | ICD-10-CM | POA: Diagnosis present

## 2022-04-14 DIAGNOSIS — Z79899 Other long term (current) drug therapy: Secondary | ICD-10-CM | POA: Diagnosis not present

## 2022-04-14 DIAGNOSIS — R627 Adult failure to thrive: Secondary | ICD-10-CM | POA: Diagnosis present

## 2022-04-14 DIAGNOSIS — R197 Diarrhea, unspecified: Secondary | ICD-10-CM

## 2022-04-14 DIAGNOSIS — A045 Campylobacter enteritis: Secondary | ICD-10-CM | POA: Diagnosis present

## 2022-04-14 DIAGNOSIS — I4719 Other supraventricular tachycardia: Secondary | ICD-10-CM | POA: Diagnosis present

## 2022-04-14 LAB — URINALYSIS, ROUTINE W REFLEX MICROSCOPIC
Bacteria, UA: NONE SEEN
Bilirubin Urine: NEGATIVE
Glucose, UA: NEGATIVE mg/dL
Ketones, ur: NEGATIVE mg/dL
Nitrite: NEGATIVE
Protein, ur: NEGATIVE mg/dL
Specific Gravity, Urine: 1.005 (ref 1.005–1.030)
pH: 6 (ref 5.0–8.0)

## 2022-04-14 LAB — BLOOD GAS, ARTERIAL
Acid-Base Excess: 13.6 mmol/L — ABNORMAL HIGH (ref 0.0–2.0)
Bicarbonate: 42.2 mmol/L — ABNORMAL HIGH (ref 20.0–28.0)
O2 Saturation: 100 %
Patient temperature: 37
pCO2 arterial: 73 mmHg (ref 32–48)
pH, Arterial: 7.37 (ref 7.35–7.45)
pO2, Arterial: 141 mmHg — ABNORMAL HIGH (ref 83–108)

## 2022-04-14 LAB — HEMOGLOBIN A1C
Hgb A1c MFr Bld: 6.5 % — ABNORMAL HIGH (ref 4.8–5.6)
Mean Plasma Glucose: 139.85 mg/dL

## 2022-04-14 LAB — LIPID PANEL
Cholesterol: 161 mg/dL (ref 0–200)
HDL: 29 mg/dL — ABNORMAL LOW (ref >40)
LDL Cholesterol: 109 mg/dL — ABNORMAL HIGH (ref 0–99)
Total CHOL/HDL Ratio: 5.6 RATIO
Triglycerides: 113 mg/dL (ref <150)
VLDL: 23 mg/dL (ref 0–40)

## 2022-04-14 LAB — LACTIC ACID, PLASMA: Lactic Acid, Venous: 0.7 mmol/L (ref 0.5–1.9)

## 2022-04-14 LAB — HIV ANTIBODY (ROUTINE TESTING W REFLEX): HIV Screen 4th Generation wRfx: NONREACTIVE

## 2022-04-14 LAB — PROCALCITONIN: Procalcitonin: <0.1 ng/mL

## 2022-04-14 LAB — GLUCOSE, CAPILLARY
Glucose-Capillary: 98 mg/dL (ref 70–99)
Glucose-Capillary: 111 mg/dL — ABNORMAL HIGH (ref 70–99)

## 2022-04-14 LAB — CBG MONITORING, ED
Glucose-Capillary: 71 mg/dL (ref 70–99)
Glucose-Capillary: 66 mg/dL — ABNORMAL LOW (ref 70–99)

## 2022-04-14 MED ORDER — FUROSEMIDE 10 MG/ML IJ SOLN
40.0000 mg | Freq: Two times a day (BID) | INTRAMUSCULAR | Status: DC
  Administered 2022-04-14 – 2022-04-21 (×14): 40 mg via INTRAVENOUS
  Filled 2022-04-14 (×14): qty 4

## 2022-04-14 MED ORDER — INSULIN ASPART 100 UNIT/ML IJ SOLN
0.0000 [IU] | Freq: Three times a day (TID) | INTRAMUSCULAR | Status: DC
  Administered 2022-04-15: 1 [IU] via SUBCUTANEOUS
  Filled 2022-04-14: qty 0.09

## 2022-04-14 MED ORDER — ACETAMINOPHEN 650 MG RE SUPP: 650.0000 mg | Freq: Four times a day (QID) | RECTAL | Status: DC | PRN

## 2022-04-14 MED ORDER — INSULIN ASPART 100 UNIT/ML IJ SOLN
0.0000 [IU] | INTRAMUSCULAR | Status: DC
  Filled 2022-04-14: qty 0.09

## 2022-04-14 MED ORDER — ENOXAPARIN SODIUM 40 MG/0.4ML IJ SOSY
40.0000 mg | PREFILLED_SYRINGE | INTRAMUSCULAR | Status: DC
  Administered 2022-04-14 – 2022-04-26 (×13): 40 mg via SUBCUTANEOUS
  Filled 2022-04-14 (×13): qty 0.4

## 2022-04-14 MED ORDER — ACETAMINOPHEN 325 MG PO TABS
650.0000 mg | ORAL_TABLET | Freq: Four times a day (QID) | ORAL | Status: DC | PRN
  Administered 2022-04-17 – 2022-04-26 (×7): 650 mg via ORAL
  Filled 2022-04-14 (×8): qty 2

## 2022-04-14 MED ORDER — FUROSEMIDE 10 MG/ML IJ SOLN
40.0000 mg | Freq: Once | INTRAMUSCULAR | Status: AC
  Administered 2022-04-14: 40 mg via INTRAVENOUS
  Filled 2022-04-14: qty 4

## 2022-04-14 NOTE — ED Provider Notes (Signed)
Assumed care at shift change.  See prior notes for full H&P.  Briefly, 61 y.o. F here with diarrhea, confusion, cough, SOB.  Has been unable to care for herself at home.  Arrived with bedbugs and cockroaches on her.  Appears to have sepsis, possibly from multiple sources-- possible PNA, UTI, cellulitis of LE.  She was given IVF, started on broad spectrum abx.  Plan:  remainder of labs, UA pending along with CT head/chest/abdomen/pelvis  Results for orders placed or performed during the hospital encounter of 04/13/22  Culture, blood (Routine X 2) w Reflex to ID Panel   Specimen: Right Antecubital; Blood  Result Value Ref Range   Specimen Description      RIGHT ANTECUBITAL BLOOD Performed at [facility] [address], [place], [place] [address]    Special Requests      BOTTLES DRAWN AEROBIC AND ANAEROBIC Blood Culture adequate volume Performed at [facility] [address], [place], [place] [address]    Culture PENDING    Report Status PENDING   Resp Panel by RT-PCR (Flu A&B, Covid) Anterior Nasal Swab   Specimen: Anterior Nasal Swab  Result Value Ref Range   SARS Coronavirus 2 by RT PCR NEGATIVE NEGATIVE   Influenza A by PCR NEGATIVE NEGATIVE   Influenza B by PCR NEGATIVE NEGATIVE  Comprehensive metabolic panel  Result Value Ref Range   Sodium 138 135 - 145 mmol/L   Potassium 4.6 3.5 - 5.1 mmol/L   Chloride 96 (L) 98 - 111 mmol/L   CO2 35 (H) 22 - 32 mmol/L   Glucose, Bld 79 70 - 99 mg/dL   BUN 6 (L) 8 - 23 mg/dL   Creatinine, Ser 0.58 0.44 - 1.00 mg/dL   Calcium 8.9 8.9 - 10.3 mg/dL   Total Protein 7.2 6.5 - 8.1 g/dL   Albumin 3.1 (L) 3.5 - 5.0 g/dL   AST 16 15 - 41 U/L   ALT 11 0 - 44 U/L   Alkaline Phosphatase 97 38 - 126 U/L   Total Bilirubin 1.3 (H) 0.3 - 1.2 mg/dL   GFR, Estimated >60 >60 mL/min   Anion gap 7 5 - 15  Blood gas, venous  Result Value Ref Range   pH, Ven 7.35 7.25 - 7.43   pCO2, Ven 80 (HH) 44 - 60 mmHg   pO2, Ven 54 (H)  32 - 45 mmHg   Bicarbonate 44.2 (H) 20.0 - 28.0 mmol/L   Acid-Base Excess 14.7 (H) 0.0 - 2.0 mmol/L   O2 Saturation 81.7 %   Patient temperature 37.0   CBC with Differential/Platelet  Result Value Ref Range   WBC 7.2 4.0 - 10.5 K/uL   RBC 5.39 (H) 3.87 - 5.11 MIL/uL   Hemoglobin 13.0 12.0 - 15.0 g/dL   HCT 45.8 36.0 - 46.0 %   MCV 85.0 80.0 - 100.0 fL   MCH 24.1 (L) 26.0 - 34.0 pg   MCHC 28.4 (L) 30.0 - 36.0 g/dL   RDW 19.4 (H) 11.5 - 15.5 %   Platelets 185 150 - 400 K/uL   nRBC 0.4 (H) 0.0 - 0.2 %   Neutrophils Relative % 55 %   Neutro Abs 3.9 1.7 - 7.7 K/uL   Lymphocytes Relative 19 %   Lymphs Abs 1.4 0.7 - 4.0 K/uL   Monocytes Relative 25 %   Monocytes Absolute 1.8 (H) 0.1 - 1.0 K/uL   Eosinophils Relative 0 %   Eosinophils Absolute 0.0 0.0 - 0.5  K/uL   Basophils Relative 0 %   Basophils Absolute 0.0 0.0 - 0.1 K/uL   Immature Granulocytes 1 %   Abs Immature Granulocytes 0.05 0.00 - 0.07 K/uL  Brain natriuretic peptide  Result Value Ref Range   B Natriuretic Peptide 973.3 (H) 0.0 - 100.0 pg/mL  Lactic acid, plasma  Result Value Ref Range   Lactic Acid, Venous 2.2 (HH) 0.5 - 1.9 mmol/L  CBG monitoring, ED  Result Value Ref Range   Glucose-Capillary 94 70 - 99 mg/dL  Troponin I (High Sensitivity)  Result Value Ref Range   Troponin I (High Sensitivity) 19 (H) <18 ng/L  Troponin I (High Sensitivity)  Result Value Ref Range   Troponin I (High Sensitivity) 18 (H) <18 ng/L   CT Angio Chest PE W/Cm &/Or Wo Cm  Result Date: 04/14/2022 CLINICAL DATA:  Altered mental status.  Shortness of breath. EXAM: CT ANGIOGRAPHY CHEST WITH CONTRAST TECHNIQUE: Multidetector CT imaging of the chest was performed using the standard protocol during bolus administration of intravenous contrast. Multiplanar CT image reconstructions and MIPs were obtained to evaluate the vascular anatomy. RADIATION DOSE REDUCTION: This exam was performed according to the departmental dose-optimization program  which includes automated exposure control, adjustment of the mA and/or kV according to patient size and/or use of iterative reconstruction technique. CONTRAST:  OMNIPAQUE IOHEXOL 350 MG/ML SOLN COMPARISON:  None Available. FINDINGS: Cardiovascular: No filling defects in the pulmonary arteries to suggest pulmonary emboli. Mild cardiomegaly. Small pericardial effusion. Scattered coronary artery and aortic calcifications. No evidence of aortic aneurysm. Mediastinum/Nodes: No mediastinal, hilar, or axillary adenopathy. Trachea and esophagus are unremarkable. Thyroid unremarkable. Lungs/Pleura: Moderate right pleural effusion and small left pleural effusion. Dependent airspace disease in the right upper lobe and consolidation in the right lower lobe. This could reflect pneumonia or compressive atelectasis. Left base atelectasis. Upper Abdomen: See abdominal CT report Musculoskeletal: Chest wall soft tissues are unremarkable. No acute bony abnormality. Review of the MIP images confirms the above findings. IMPRESSION: No evidence of pulmonary embolus. Coronary artery disease, mild cardiomegaly. Bilateral effusions, right greater than left. Right upper lobe and right lower lobe airspace opacities could reflect pneumonia or compressive atelectasis. Aortic Atherosclerosis (ICD10-I70.0). Electronically Signed   By: Charlett Nose M.D.   On: 04/14/2022 00:48   CT Abdomen Pelvis W Contrast  Result Date: 04/14/2022 CLINICAL DATA:  Abdominal pain EXAM: CT ABDOMEN AND PELVIS WITH CONTRAST TECHNIQUE: Multidetector CT imaging of the abdomen and pelvis was performed using the standard protocol following bolus administration of intravenous contrast. RADIATION DOSE REDUCTION: This exam was performed according to the departmental dose-optimization program which includes automated exposure control, adjustment of the mA and/or kV according to patient size and/or use of iterative reconstruction technique. CONTRAST:  OMNIPAQUE  IOHEXOL 350 MG/ML SOLN COMPARISON:  None Available. FINDINGS: Lower chest: Small right pleural effusion. Right lower lobe atelectasis or infiltrate. Minimal left base atelectasis. Small pericardial effusion. Hepatobiliary: Heterogeneous enhancement of the liver. Pericholecystic fluid noted. No visible stones or biliary ductal dilatation. Pancreas: No focal abnormality or ductal dilatation. Spleen: No focal abnormality.  Normal size. Adrenals/Urinary Tract: 2.5 cm cyst in the upper pole of the right kidney. No follow-up imaging recommended. No stones or hydronephrosis. Adrenal glands and urinary bladder unremarkable. Stomach/Bowel: Stomach, large and small bowel grossly unremarkable. Vascular/Lymphatic: Aortic atherosclerosis. No evidence of aneurysm or adenopathy. Reproductive: Enhanced seen area centrally within the uterus measures 1.4 cm. This could reflect a submucosal fibroid. Cannot exclude endometrial polyp or other endometrial  lesion. No adnexal mass. Other: No free fluid or free air. Diffuse edema throughout the abdominal wall compatible with anasarca. Musculoskeletal: No acute bony abnormality. IMPRESSION: Heterogeneous enhancement throughout the liver. Recommend correlation with LFTs to exclude cirrhosis or hepatitis. Small right pleural effusion. Right lower lobe atelectasis or infiltrate. 1.4 cm enhanced seen nodule centrally within the uterus which could reflect a submucosal fibroid or endometrial polyp. Consider further evaluation with elective pelvic ultrasound. Diffuse anasarca throughout the abdominal wall. Aortic atherosclerosis. Electronically Signed   By: Charlett Nose M.D.   On: 04/14/2022 00:45   CT Head Wo Contrast  Result Date: 04/14/2022 CLINICAL DATA:  Altered mental status EXAM: CT HEAD WITHOUT CONTRAST TECHNIQUE: Contiguous axial images were obtained from the base of the skull through the vertex without intravenous contrast. RADIATION DOSE REDUCTION: This exam was performed according  to the departmental dose-optimization program which includes automated exposure control, adjustment of the mA and/or kV according to patient size and/or use of iterative reconstruction technique. COMPARISON:  None Available. FINDINGS: Brain: No acute intracranial abnormality. Specifically, no hemorrhage, hydrocephalus, mass lesion, acute infarction, or significant intracranial injury. Vascular: No hyperdense vessel or unexpected calcification. Skull: No acute calvarial abnormality. Sinuses/Orbits: No acute findings Other: None IMPRESSION: No acute intracranial abnormality. Electronically Signed   By: Charlett Nose M.D.   On: 04/14/2022 00:41   DG Chest Port 1 View  Result Date: 04/13/2022 CLINICAL DATA:  Cough. EXAM: PORTABLE CHEST 1 VIEW COMPARISON:  None Available. FINDINGS: The heart is enlarged and the mediastinal contour is within normal limits. The pulmonary vasculature is distended. Atherosclerotic calcification of the aorta is noted. Hazy opacities are noted in the mid to lower lung field on the right. The left lung is clear. No pneumothorax. No acute osseous abnormality. IMPRESSION: 1. Cardiomegaly with pulmonary vascular congestion. 2. Hazy opacities in the mid to lower lung field on the right, possible layering pleural effusion versus edema or infiltrate. Electronically Signed   By: Thornell Sartorius M.D.   On: 04/13/2022 21:37    CT head negative for any acute findings.  CTA chest with bilateral effusions, airspace disease concerning for pneumonia.  CT abdomen pelvis without acute findings--heterogeneous appearance of the liver, however normal LFTs.  Lactate is 2.2.  IV antibiotics continue infusing.  Her BP has been stable.  Discussed with Dr. Loney Loh-- will admit for ongoing care.   Garlon Hatchet, PA-C 04/14/22 0356    Molpus, Jonny Ruiz, MD 04/14/22 [phone]

## 2022-04-14 NOTE — H&P (Signed)
History and Physical    Ellen Acevedo [id number] DOB: [birth year] DOA: 04/13/2022  PCP: System, Provider Not In  Patient coming from: Home  Chief Complaint: Diarrhea  HPI: Ellen Acevedo is a 61 y.o. female with class III obesity and no documented past medical history presented to the ED via EMS from home for evaluation of diarrhea, confusion, cough, and shortness of breath.  Police Department was called for wellness check and patient found at home with bedbugs and roaches.  SPO2 77% on room air with EMS, improved with 4 L O2.  Vital signs on arrival to the ED: Temperature 98.1 F, heart rate 106, respiratory rate 23, blood pressure 159/101, SPO2 100% on 4 L O2.  Labs showing no leukocytosis, lactic acid 2.2 > 0.7, blood cultures drawn, troponin 19> 18, BNP 973, COVID and influenza PCR negative.  UA with negative nitrite, moderate leukocytes, and microscopy showing 0-5 WBCs and no bacteria.  Urine culture pending.  VBG showing pH 7.35, PCO2 80.  She was placed on BiPAP.  CTA chest negative for PE.  Showing aortic atherosclerosis, coronary artery disease, mild cardiomegaly, bilateral pleural effusions, and right upper and lower lobe airspace opacities suspicious for pneumonia versus atelectasis.  CT abdomen pelvis showing heterogeneous enhancement throughout the liver, 1.4 cm nodule within the uterus which could reflect a submucosal fibroid or endometrial polyp.  Pelvic ultrasound recommended for further evaluation.  CT also showing diffuse anasarca throughout the abdominal wall and aortic atherosclerosis.  CT head negative for acute finding. Patient was given vancomycin, cefepime, metronidazole, and 1 L normal saline bolus.  History limited as patient is currently on BiPAP.  He reports diarrhea for the past 3 weeks and mild generalized abdominal pain.  Reports shortness of breath and bilateral lower extremity edema for several months.  Denies chest pain.  She reports medical history of  schizophrenia, diabetes, hypertension, hyperlipidemia, and "heart problems."  Review of Systems:  Review of Systems  All other systems reviewed and are negative.   History reviewed. No pertinent past medical history.  History reviewed. No pertinent surgical history.   has no history on file for tobacco use, alcohol use, and drug use.  Allergies  Allergen Reactions   Trilafon [Perphenazine] Rash    History reviewed. No pertinent family history.  Prior to Admission medications   Not on File    Physical Exam: Vitals:   04/14/22 0308 04/14/22 0315 04/14/22 0330 04/14/22 0434  BP:  113/74 108/69   Pulse:  89 90 95  Resp:  19 17 20   Temp: 97.6 F (36.4 C)     TempSrc: Oral     SpO2:  95% 99%   Weight:      Height:        Physical Exam Vitals reviewed.  Constitutional:      General: She is not in acute distress. HENT:     Head: Normocephalic and atraumatic.  Eyes:     Extraocular Movements: Extraocular movements intact.  Cardiovascular:     Rate and Rhythm: Normal rate and regular rhythm.     Pulses: Normal pulses.  Pulmonary:     Effort: Pulmonary effort is normal. No respiratory distress.     Breath sounds: No wheezing.  Abdominal:     General: Bowel sounds are normal. There is no distension.     Palpations: Abdomen is soft.     Tenderness: There is no abdominal tenderness.  Musculoskeletal:     Cervical back: Normal range of motion.  Right lower leg: Edema present.     Left lower leg: Edema present.     Comments: +3 pitting edema and mild erythema of bilateral lower legs  Skin:    General: Skin is warm and dry.  Neurological:     General: No focal deficit present.     Mental Status: She is alert and oriented to person, place, and time.     Labs on Admission: I have personally reviewed following labs and imaging studies  CBC: Recent Labs  Lab 04/13/22 2045  WBC 7.2  NEUTROABS 3.9  HGB 13.0  HCT 45.8  MCV 85.0  PLT [id number]   Basic Metabolic  Panel: Recent Labs  Lab 04/13/22 1900  NA 138  K 4.6  CL 96*  CO2 35*  GLUCOSE 79  BUN 6*  CREATININE 0.58  CALCIUM 8.9   GFR: Estimated Creatinine Clearance: 89.1 mL/min (by C-G formula based on SCr of 0.58 mg/dL). Liver Function Tests: Recent Labs  Lab 04/13/22 1900  AST 16  ALT 11  ALKPHOS 97  BILITOT 1.3*  PROT 7.2  ALBUMIN 3.1*   No results for input(s): "LIPASE", "AMYLASE" in the last 168 hours. No results for input(s): "AMMONIA" in the last 168 hours. Coagulation Profile: No results for input(s): "INR", "PROTIME" in the last 168 hours. Cardiac Enzymes: No results for input(s): "CKTOTAL", "CKMB", "CKMBINDEX", "TROPONINI" in the last 168 hours. BNP (last 3 results) No results for input(s): "PROBNP" in the last 8760 hours. HbA1C: No results for input(s): "HGBA1C" in the last 72 hours. CBG: Recent Labs  Lab 04/13/22 1645  GLUCAP 94   Lipid Profile: No results for input(s): "CHOL", "HDL", "LDLCALC", "TRIG", "CHOLHDL", "LDLDIRECT" in the last 72 hours. Thyroid Function Tests: No results for input(s): "TSH", "T4TOTAL", "FREET4", "T3FREE", "THYROIDAB" in the last 72 hours. Anemia Panel: No results for input(s): "VITAMINB12", "FOLATE", "FERRITIN", "TIBC", "IRON", "RETICCTPCT" in the last 72 hours. Urine analysis:    Component Value Date/Time   COLORURINE YELLOW 04/14/2022 0018   APPEARANCEUR CLEAR 04/14/2022 0018   LABSPEC 1.005 04/14/2022 0018   PHURINE 6.0 04/14/2022 0018   GLUCOSEU NEGATIVE 04/14/2022 0018   HGBUR MODERATE (A) 04/14/2022 0018   BILIRUBINUR NEGATIVE 04/14/2022 0018   KETONESUR NEGATIVE 04/14/2022 0018   PROTEINUR NEGATIVE 04/14/2022 0018   UROBILINOGEN 1.0 11/30/2008 1156   NITRITE NEGATIVE 04/14/2022 0018   LEUKOCYTESUR MODERATE (A) 04/14/2022 0018    Radiological Exams on Admission: CT Angio Chest PE W/Cm &/Or Wo Cm  Result Date: 04/14/2022 CLINICAL DATA:  Altered mental status.  Shortness of breath. EXAM: CT ANGIOGRAPHY CHEST  WITH CONTRAST TECHNIQUE: Multidetector CT imaging of the chest was performed using the standard protocol during bolus administration of intravenous contrast. Multiplanar CT image reconstructions and MIPs were obtained to evaluate the vascular anatomy. RADIATION DOSE REDUCTION: This exam was performed according to the departmental dose-optimization program which includes automated exposure control, adjustment of the mA and/or kV according to patient size and/or use of iterative reconstruction technique. CONTRAST:  144mL OMNIPAQUE IOHEXOL 350 MG/ML SOLN COMPARISON:  None Available. FINDINGS: Cardiovascular: No filling defects in the pulmonary arteries to suggest pulmonary emboli. Mild cardiomegaly. Small pericardial effusion. Scattered coronary artery and aortic calcifications. No evidence of aortic aneurysm. Mediastinum/Nodes: No mediastinal, hilar, or axillary adenopathy. Trachea and esophagus are unremarkable. Thyroid unremarkable. Lungs/Pleura: Moderate right pleural effusion and small left pleural effusion. Dependent airspace disease in the right upper lobe and consolidation in the right lower lobe. This could reflect pneumonia or compressive atelectasis. Left  base atelectasis. Upper Abdomen: See abdominal CT report Musculoskeletal: Chest wall soft tissues are unremarkable. No acute bony abnormality. Review of the MIP images confirms the above findings. IMPRESSION: No evidence of pulmonary embolus. Coronary artery disease, mild cardiomegaly. Bilateral effusions, right greater than left. Right upper lobe and right lower lobe airspace opacities could reflect pneumonia or compressive atelectasis. Aortic Atherosclerosis (ICD10-I70.0). Electronically Signed   By: Rolm Baptise M.D.   On: 04/14/2022 00:48   CT Abdomen Pelvis W Contrast  Result Date: 04/14/2022 CLINICAL DATA:  Abdominal pain EXAM: CT ABDOMEN AND PELVIS WITH CONTRAST TECHNIQUE: Multidetector CT imaging of the abdomen and pelvis was performed using  the standard protocol following bolus administration of intravenous contrast. RADIATION DOSE REDUCTION: This exam was performed according to the departmental dose-optimization program which includes automated exposure control, adjustment of the mA and/or kV according to patient size and/or use of iterative reconstruction technique. CONTRAST:  162mL OMNIPAQUE IOHEXOL 350 MG/ML SOLN COMPARISON:  None Available. FINDINGS: Lower chest: Small right pleural effusion. Right lower lobe atelectasis or infiltrate. Minimal left base atelectasis. Small pericardial effusion. Hepatobiliary: Heterogeneous enhancement of the liver. Pericholecystic fluid noted. No visible stones or biliary ductal dilatation. Pancreas: No focal abnormality or ductal dilatation. Spleen: No focal abnormality.  Normal size. Adrenals/Urinary Tract: 2.5 cm cyst in the upper pole of the right kidney. No follow-up imaging recommended. No stones or hydronephrosis. Adrenal glands and urinary bladder unremarkable. Stomach/Bowel: Stomach, large and small bowel grossly unremarkable. Vascular/Lymphatic: Aortic atherosclerosis. No evidence of aneurysm or adenopathy. Reproductive: Enhanced seen area centrally within the uterus measures 1.4 cm. This could reflect a submucosal fibroid. Cannot exclude endometrial polyp or other endometrial lesion. No adnexal mass. Other: No free fluid or free air. Diffuse edema throughout the abdominal wall compatible with anasarca. Musculoskeletal: No acute bony abnormality. IMPRESSION: Heterogeneous enhancement throughout the liver. Recommend correlation with LFTs to exclude cirrhosis or hepatitis. Small right pleural effusion. Right lower lobe atelectasis or infiltrate. 1.4 cm enhanced seen nodule centrally within the uterus which could reflect a submucosal fibroid or endometrial polyp. Consider further evaluation with elective pelvic ultrasound. Diffuse anasarca throughout the abdominal wall. Aortic atherosclerosis. Electronically  Signed   By: Rolm Baptise M.D.   On: 04/14/2022 00:45   CT Head Wo Contrast  Result Date: 04/14/2022 CLINICAL DATA:  Altered mental status EXAM: CT HEAD WITHOUT CONTRAST TECHNIQUE: Contiguous axial images were obtained from the base of the skull through the vertex without intravenous contrast. RADIATION DOSE REDUCTION: This exam was performed according to the departmental dose-optimization program which includes automated exposure control, adjustment of the mA and/or kV according to patient size and/or use of iterative reconstruction technique. COMPARISON:  None Available. FINDINGS: Brain: No acute intracranial abnormality. Specifically, no hemorrhage, hydrocephalus, mass lesion, acute infarction, or significant intracranial injury. Vascular: No hyperdense vessel or unexpected calcification. Skull: No acute calvarial abnormality. Sinuses/Orbits: No acute findings Other: None IMPRESSION: No acute intracranial abnormality. Electronically Signed   By: Rolm Baptise M.D.   On: 04/14/2022 00:41   DG Chest Port 1 View  Result Date: 04/13/2022 CLINICAL DATA:  Cough. EXAM: PORTABLE CHEST 1 VIEW COMPARISON:  None Available. FINDINGS: The heart is enlarged and the mediastinal contour is within normal limits. The pulmonary vasculature is distended. Atherosclerotic calcification of the aorta is noted. Hazy opacities are noted in the mid to lower lung field on the right. The left lung is clear. No pneumothorax. No acute osseous abnormality. IMPRESSION: 1. Cardiomegaly with pulmonary vascular congestion. 2. Hazy opacities  in the mid to lower lung field on the right, possible layering pleural effusion versus edema or infiltrate. Electronically Signed   By: Brett Fairy M.D.   On: 04/13/2022 21:37    EKG: Independently reviewed.  Sinus tachycardia, LAFB, RBBB.  No prior tracing for comparison.  Assessment and Plan  Acute hypoxemic respiratory failure Acute CHF CAP? SPO2 77% on room air with EMS and VBG showing  PCO2 80.  She was placed on BiPAP in the ED and currently stable.  CTA chest negative for PE, showing mild cardiomegaly, bilateral pleural effusions, and right upper and lower lobe airspace opacity suspicious for pneumonia versus atelectasis.  Patient appears significantly volume overloaded on exam with bilateral lower extremity edema/anasarca.  BNP 973.  COVID and influenza PCR negative.  No fever or leukocytosis. Lactic acid 2.2 > 0.7.  Does not meet any SIRS criteria at this time. -Continue BiPAP, repeat blood gas ordered -Patient received broad-spectrum antibiotics in the ED, check procalcitonin level -IV Lasix 40 mg x 1 and echocardiogram ordered -Blood cultures pending  Diarrhea No fever or leukocytosis.  CT without evidence of colitis. -C. difficile PCR and GI pathogen panel -Enteric precautions  Confusion? Likely due to hypoxia.  At present, patient is answering questions appropriately.  CT head negative and no focal neurodeficit. -Continue to monitor  Cellulitis? Lower extremities appear slightly erythematous.  No fever or leukocytosis. -Patient already received broad-spectrum antibiotics, procalcitonin pending  CAD Seen on CT.  Troponin borderline elevated in the setting of acute CHF and stable, not consistent with ACS.  Patient is not endorsing chest pain. -Unclear whether she is on medications, no prior records available.  Pharmacy med rec pending.  Abnormal liver seen on CT CT showing heterogeneous enhancement throughout the liver.  No significant elevation of LFTs. -Right upper quadrant ultrasound  Uterine nodule seen on CT CT showing a 1.4 cm nodule within the uterus which could reflect a submucosal fibroid or endometrial polyp. -Pelvic ultrasound ordered for further evaluation  Schizophrenia Diabetes: Check A1c and order sensitive sliding scale insulin. Hypertension Hyperlipidemia -Pharmacy med rec pending.  DVT prophylaxis: Lovenox Code Status: Full Code  (discussed with the patient) Family Communication: No family available at this time. Level of care: Step Down Unit Admission status: It is my clinical opinion that admission to INPATIENT is reasonable and necessary because of the expectation that this patient will require hospital care that crosses at least 2 midnights to treat this condition based on the medical complexity of the problems presented.  Given the aforementioned information, the predictability of an adverse outcome is felt to be significant.   Shela Leff MD [facility]  If 7PM-7AM, please contact night-coverage [email or web address]  04/14/2022, 5:01 AM

## 2022-04-14 NOTE — Progress Notes (Signed)
Pt taken off bipap at this time, tolerating well

## 2022-04-14 NOTE — Progress Notes (Addendum)
PROGRESS NOTE    Ellen Acevedo  JKD:[id number] DOB: 1960-09-15 DOA: 04/13/2022 PCP: System, Provider Not In    Brief Narrative:   Ellen Acevedo is a 61 y.o. female with past medical history significant for schizophrenia, type 2 diabetes mellitus, essential hypertension, hyperlipidemia and reported "heart problems" but no documented past medical history who presented to [facility] ED on 10/14 via EMS from home for evaluation of diarrhea, confusion, cough and shortness of breath.  The [employer] was called for a wellness check and patient was found at home with bedbugs and roaches.  Patient reports diarrhea over the last 3 weeks with mild generalized abdominal pain.  Also with shortness of breath, bilateral lower extremity edema for several months.  Denies chest pain.  Denies any recent dietary changes.  Cannot recall what medications she takes at baseline.  In the ED, temperature 98.1 F, HR 106, RR 23, BP 159/1 1, SPO2 100% on 4 L nasal cannula.  VBG with pH 7.35, PCO2 80, PO2 54.  Sodium 138, potassium 4.6, chloride 96, CO2 35, glucose 79, BUN 6, creatinine 0.58.  AST 16, ALT 11, total bilirubin 1.3.  BNP 973.3.  High sensitive troponin 19> 18.  Lactic acid 2.2.  WBC 7.2, hemoglobin 13.0, platelet 185.  COVID-19 PCR negative parainfluenza A/B PCR negative.  Urinalysis with moderate leukocytes, negative nitrite, no bacteria, 0-5 WBCs.  CT head without contrast with no acute intracranial abnormality.  CT angiogram chest with no evidence of pulmonary embolism, CAD and mild cardiomegaly, bilateral effusions, right greater than left, right upper lobe and right lower lobe airspace opacities could reflect pneumonia versus compressive atelectasis.  CT abdomen/pelvis with heterogeneous enhancement throughout the liver, small right pleural effusion, right lower lobe atelectasis versus infiltrate, 1.4 cm enhancing nodule centrally within the uterus could reflect a submucosal fibroid or endometrial polyp,  diffuse anasarca throughout the abdominal wall, aortic atherosclerosis.  Blood cultures and urine cultures obtained.  Patient was given vancomycin, cefepime, metronidazole and 1 L NS bolus.  Patient was placed on BiPAP.  TRH consulted for further evaluation management of acute metabolic encephalopathy, acute hypoxic/hypercapnic respiratory failure.  Assessment & Plan:   Acute metabolic encephalopathy: Improving Patient presenting via EMS with confusion, likely secondary to hypercapnic and hypoxic respiratory failure.  Patient is afebrile without leukocytosis.  CT head with no acute findings.  No focal neurological deficit. --Supportive care, treatment as below  Acute hypoxic/hypercapnic respite failure, POA Acute congestive heart failure exacerbation, undifferentiated Patient was noted to be hypoxic with SPO2 84% on room air and placed on supplemental oxygen.  On arrival to the ED VBG notable for elevated CO2 level of 80.  BNP elevated at 973.3.  CT angiogram chest negative for PE, questionable lung opacity likely related to atelectasis as patient afebrile without leukocytosis.  Procalciton within normal limits.  Etiology likely multifactorial, in the setting of morbid obesity and likely undiagnosed CHF. --TTE: Pending -- Furosemide 40 mg IV q12h -- Heart healthy diet, fluid restriction 1800 mL daily -- Continue BiPAP as needed and nightly -- Titrate supplemental oxygen to maintain SPO2 greater than 92% -- Strict I's and O's and daily weights -- Repeat BNP, VBG in the a.m.  Diarrhea Patient is afebrile without leukocytosis, CT abdomen/pelvis without evidence of colitis.  Procalcitonin negative. --GI PCR/C. difficile colitis PCR panel: Pending -- Continue enteric precautions for now  CAD Noted on CT chest.  Troponin slightly elevated at 19 followed by 18; stable.  Denies chest pain.  Not consistent  with ACS.  EKG with no concerning dynamic changes. --Continue to monitor on telemetry  Fatty  liver disease CT notable for heterogeneous enhancement throughout the liver.  LFTs within normal limits.  Right upper quadrant ultrasound with mild increased echogenicity throughout the liver without focal lesion suggestive of hepatic steatosis, pericholecystic fluid without evidence for cholelithiasis, gallbladder not distended.  Uterine abnormality CT abdomen/pelvis with 1.4 cm enhancing nodule centrally within the uterus could reflect a submucosal fibroid or endometrial polyp.  Pelvic ultrasound Limited examination due to morbid obesity but with endometrial thickness considered abnormal for asymptomatic postmenopausal result patient with recommendation of endometrial sampling to exclude carcinoma.  Outpatient follow-up with GYN.  Type 2 diabetes mellitus Hemoglobin A1c 6.5.  Patient does not recall her medications he takes and awaiting pharmacy reconciliation. --SSI for coverage --CBGs qAC/HS  Essential hypertension Patient does not recall her home medications.  BP 108/69. -- Continue to monitor off of antihypertensives at this time  Hyperlipidemia -- Check lipid panel  History of reported schizophrenia Patient does not recall her medications, pending medication reconciliation  Weakness/debility/deconditioning: -- PT/OT evaluation    DVT prophylaxis: enoxaparin (LOVENOX) injection 40 mg Start: 04/14/22 2200    Code Status: Full Code Family Communication: No family present at bedside this morning  Disposition Plan:  Level of care: Progressive Status is: Inpatient Remains inpatient appropriate because: IV diuresis, requiring BiPAP, anticipate likely need of placement    Consultants:  None  Procedures:  Pelvic ultrasound Right upper quadrant ultrasound TTE: Pending  Antimicrobials:  Vancomycin 10/14 - 10/14 Cefepime 10/14 - 10/14 Metronidazole 10/14 - 10/14   Subjective: Patient seen examined bedside, resting comfortably.  Remains in ED holding area.  Currently on  BiPAP.  Remains slightly confused.  Cannot recall her home medications.  Reports shortness of breath improved.  States has had diarrhea for the last 3 months.  No other specific complaints or concerns at this time.  Denies headache, no dizziness, no chest pain, no abdominal pain, no nausea/vomiting, no fever/chills/night sweats, no focal weakness, no paresthesias.  No acute events overnight per nursing staff.  Objective: Vitals:   04/14/22 0952 04/14/22 0955 04/14/22 1130 04/14/22 1154  BP: 129/83  123/80   Pulse: (!) 104 (!) 102 (!) 102   Resp: 18 20 14    Temp:    98.3 F (36.8 C)  TempSrc:    Oral  SpO2: 95% 96% 99%   Weight:      Height:       No intake or output data in the 24 hours ending 04/14/22 1259 Filed Weights   04/13/22 1649  Weight: 108.9 kg    Examination:  Physical Exam: GEN: NAD, alert, on BiPAP, morbidly obese, appears older than stated age, chronically ill in appearance HEENT: NCAT, PERRL, EOMI, sclera clear, MMM PULM: Diminished breath sounds bilateral bases, no wheezing/crackles, normal respiratory effort without accessory muscle use, on BiPAP CV: RRR w/o M/G/R GI: abd soft, NTND, NABS, no R/G/M MSK: Chronic venous changes bilateral lower extremities with 2-3+ pitting edema to upper shin, moves all extremities independently  NEURO: CN II-XII intact, no focal deficits, sensation to light touch intact PSYCH: normal mood/affect Integumentary: Chronic venous stasis changes bilateral lower extremities, otherwise no other concerning rashes/lesions/wounds appreciated on exposed skin surfaces.      Data Reviewed: I have personally reviewed following labs and imaging studies  CBC: Recent Labs  Lab 04/13/22 2045  WBC 7.2  NEUTROABS 3.9  HGB 13.0  HCT 45.8  MCV 85.0  PLT 185   Basic Metabolic Panel: Recent Labs  Lab 04/13/22 1900  NA 138  K 4.6  CL 96*  CO2 35*  GLUCOSE 79  BUN 6*  CREATININE 0.58  CALCIUM 8.9   GFR: Estimated Creatinine  Clearance: 89.1 mL/min (by C-G formula based on SCr of 0.58 mg/dL). Liver Function Tests: Recent Labs  Lab 04/13/22 1900  AST 16  ALT 11  ALKPHOS 97  BILITOT 1.3*  PROT 7.2  ALBUMIN 3.1*   No results for input(s): "LIPASE", "AMYLASE" in the last 168 hours. No results for input(s): "AMMONIA" in the last 168 hours. Coagulation Profile: No results for input(s): "INR", "PROTIME" in the last 168 hours. Cardiac Enzymes: No results for input(s): "CKTOTAL", "CKMB", "CKMBINDEX", "TROPONINI" in the last 168 hours. BNP (last 3 results) No results for input(s): "PROBNP" in the last 8760 hours. HbA1C: Recent Labs    04/14/22 0902  HGBA1C 6.5*   CBG: Recent Labs  Lab 04/13/22 1645 04/14/22 0815  GLUCAP 94 71   Lipid Profile: No results for input(s): "CHOL", "HDL", "LDLCALC", "TRIG", "CHOLHDL", "LDLDIRECT" in the last 72 hours. Thyroid Function Tests: No results for input(s): "TSH", "T4TOTAL", "FREET4", "T3FREE", "THYROIDAB" in the last 72 hours. Anemia Panel: No results for input(s): "VITAMINB12", "FOLATE", "FERRITIN", "TIBC", "IRON", "RETICCTPCT" in the last 72 hours. Sepsis Labs: Recent Labs  Lab 04/13/22 1900 04/14/22 0305 04/14/22 0929  PROCALCITON  --   --  <0.10  LATICACIDVEN 2.2* 0.7  --     Recent Results (from the past 240 hour(s))  Culture, blood (Routine X 2) w Reflex to ID Panel     Status: None (Preliminary result)   Collection Time: 04/13/22  6:25 PM   Specimen: Right Antecubital; Blood  Result Value Ref Range Status   Specimen Description   Final    RIGHT ANTECUBITAL BLOOD Performed at [facility], [address], [place], [place] [address]    Special Requests   Final    BOTTLES DRAWN AEROBIC AND ANAEROBIC Blood Culture adequate volume Performed at [facility], [address], [place], [place] [address]    Culture PENDING  Incomplete   Report Status PENDING  Incomplete  Culture, blood (Routine X 2) w Reflex to ID Panel      Status: None (Preliminary result)   Collection Time: 04/13/22  8:45 PM   Specimen: Left Antecubital; Blood  Result Value Ref Range Status   Specimen Description   Final    LEFT ANTECUBITAL BLOOD Performed at [facility], [address], [place], [place] [address]    Special Requests   Final    BOTTLES DRAWN AEROBIC AND ANAEROBIC Blood Culture results may not be optimal due to an excessive volume of blood received in culture bottles Performed at [facility], [address] [place], [place] [address]    Culture PENDING  Incomplete   Report Status PENDING  Incomplete  Resp Panel by RT-PCR (Flu A&B, Covid) Anterior Nasal Swab     Status: None   Collection Time: 04/13/22  8:50 PM   Specimen: Anterior Nasal Swab  Result Value Ref Range Status   SARS Coronavirus 2 by RT PCR NEGATIVE NEGATIVE Final    Comment: (NOTE) SARS-CoV-2 target nucleic acids are NOT DETECTED.  The SARS-CoV-2 RNA is generally detectable in upper respiratory specimens during the acute phase of infection. The lowest concentration of SARS-CoV-2 viral copies this assay can detect is 138 copies/mL. A negative result does not preclude  SARS-Cov-2 infection and should not be used as the sole basis for treatment or other patient management decisions. A negative result may occur with  improper specimen collection/handling, submission of specimen other than nasopharyngeal swab, presence of viral mutation(s) within the areas targeted by this assay, and inadequate number of viral copies(<138 copies/mL). A negative result must be combined with clinical observations, patient history, and epidemiological information. The expected result is Negative.  Fact Sheet for Patients:  BloggerCourse.com  Fact Sheet for Healthcare Providers:  SeriousBroker.it  This test is no t yet approved or cleared by the [place] FDA and  has been authorized for  detection and/or diagnosis of SARS-CoV-2 by FDA under an Emergency Use Authorization (EUA). This EUA will remain  in effect (meaning this test can be used) for the duration of the COVID-19 declaration under Section 564(b)(1) of the Act, 21 U.S.C.section 360bbb-3(b)(1), unless the authorization is terminated  or revoked sooner.       Influenza A by PCR NEGATIVE NEGATIVE Final   Influenza B by PCR NEGATIVE NEGATIVE Final    Comment: (NOTE) The Xpert Xpress SARS-CoV-2/FLU/RSV plus assay is intended as an aid in the diagnosis of influenza from Nasopharyngeal swab specimens and should not be used as a sole basis for treatment. Nasal washings and aspirates are unacceptable for Xpert Xpress SARS-CoV-2/FLU/RSV testing.  Fact Sheet for Patients: BloggerCourse.com  Fact Sheet for Healthcare Providers: SeriousBroker.it  This test is not yet approved or cleared by the [place] FDA and has been authorized for detection and/or diagnosis of SARS-CoV-2 by FDA under an Emergency Use Authorization (EUA). This EUA will remain in effect (meaning this test can be used) for the duration of the COVID-19 declaration under Section 564(b)(1) of the Act, 21 U.S.C. section 360bbb-3(b)(1), unless the authorization is terminated or revoked.  Performed at [facility], [address], [place], [place] [address]          Radiology Studies: US PELVIC COMPLETE WITH TRANSVAGINAL  Result Date: 04/14/2022 CLINICAL DATA:  62 year old female history of enhancing lesion in the uterus on prior CT examination. Follow-up study. EXAM: TRANSABDOMINAL AND TRANSVAGINAL ULTRASOUND OF PELVIS DOPPLER ULTRASOUND OF OVARIES TECHNIQUE: Both transabdominal and transvaginal ultrasound examinations of the pelvis were performed. Transabdominal technique was performed for global imaging of the pelvis including uterus, ovaries, adnexal regions, and pelvic  cul-de-sac. It was necessary to proceed with endovaginal exam following the transabdominal exam to visualize the adnexal regions. Color and duplex Doppler ultrasound was utilized to evaluate blood flow to the ovaries. COMPARISON:  Pelvic ultrasound 10/09/2004. FINDINGS: Comment: Today's study was severely limited by patient's morbid obesity. Uterus Measurements: 10.7 x 4.3 x 4.5 cm = volume: 108.8 mL. No fibroids or other mass visualized. Endometrium Thickness: 13 mm. Poorly demonstrated on today's suboptimal examination. Right ovary Could not be visualized. Left ovary Could not be visualized. Other findings Trace volume of free fluid in the cul-de-sac. IMPRESSION: 1. Exceedingly limited examination secondary to patient's morbid obesity. Endometrial thickness is considered abnormal for an asymptomatic post-menopausal female. Endometrial sampling should be considered to exclude carcinoma. Electronically Signed   By: Trudie Reed M.D.   On: 04/14/2022 08:57   US Abdomen Limited RUQ (LIVER/GB)  Result Date: 04/14/2022 CLINICAL DATA:  Abnormal CT scan.  Possible cirrhosis. EXAM: ULTRASOUND ABDOMEN LIMITED RIGHT UPPER QUADRANT COMPARISON:  CT abdomen and pelvis 04/14/22 at 12:03 a.m. FINDINGS: Gallbladder: The gallbladder is not distended. Wall thickness measures 2 mm. Pericholecystic fluid is noted. Common bile duct: Diameter:  3.0 mm, within normal limits. Liver: Mild increased echogenicity is present throughout the liver. No discrete lesions are present. Portal vein is patent on color Doppler imaging with normal direction of blood flow towards the liver. Other: Right pleural effusion noted. IMPRESSION: 1. Mild increased echogenicity throughout the liver without focal lesion. This suggests hepatic steatosis. 2. Pericholecystic fluid without evidence for cholelithiasis. Gallbladder is not distended. Fluid likely related to overall anasarca. 3. Right pleural effusion. Electronically Signed   By: Marin Roberts M.D.   On: 04/14/2022 08:37   CT Angio Chest PE W/Cm &/Or Wo Cm  Result Date: 04/14/2022 CLINICAL DATA:  Altered mental status.  Shortness of breath. EXAM: CT ANGIOGRAPHY CHEST WITH CONTRAST TECHNIQUE: Multidetector CT imaging of the chest was performed using the standard protocol during bolus administration of intravenous contrast. Multiplanar CT image reconstructions and MIPs were obtained to evaluate the vascular anatomy. RADIATION DOSE REDUCTION: This exam was performed according to the departmental dose-optimization program which includes automated exposure control, adjustment of the mA and/or kV according to patient size and/or use of iterative reconstruction technique. CONTRAST:  OMNIPAQUE IOHEXOL 350 MG/ML SOLN COMPARISON:  None Available. FINDINGS: Cardiovascular: No filling defects in the pulmonary arteries to suggest pulmonary emboli. Mild cardiomegaly. Small pericardial effusion. Scattered coronary artery and aortic calcifications. No evidence of aortic aneurysm. Mediastinum/Nodes: No mediastinal, hilar, or axillary adenopathy. Trachea and esophagus are unremarkable. Thyroid unremarkable. Lungs/Pleura: Moderate right pleural effusion and small left pleural effusion. Dependent airspace disease in the right upper lobe and consolidation in the right lower lobe. This could reflect pneumonia or compressive atelectasis. Left base atelectasis. Upper Abdomen: See abdominal CT report Musculoskeletal: Chest wall soft tissues are unremarkable. No acute bony abnormality. Review of the MIP images confirms the above findings. IMPRESSION: No evidence of pulmonary embolus. Coronary artery disease, mild cardiomegaly. Bilateral effusions, right greater than left. Right upper lobe and right lower lobe airspace opacities could reflect pneumonia or compressive atelectasis. Aortic Atherosclerosis (ICD10-I70.0). Electronically Signed   By: Charlett Nose M.D.   On: 04/14/2022 00:48   CT Abdomen Pelvis W  Contrast  Result Date: 04/14/2022 CLINICAL DATA:  Abdominal pain EXAM: CT ABDOMEN AND PELVIS WITH CONTRAST TECHNIQUE: Multidetector CT imaging of the abdomen and pelvis was performed using the standard protocol following bolus administration of intravenous contrast. RADIATION DOSE REDUCTION: This exam was performed according to the departmental dose-optimization program which includes automated exposure control, adjustment of the mA and/or kV according to patient size and/or use of iterative reconstruction technique. CONTRAST:  OMNIPAQUE IOHEXOL 350 MG/ML SOLN COMPARISON:  None Available. FINDINGS: Lower chest: Small right pleural effusion. Right lower lobe atelectasis or infiltrate. Minimal left base atelectasis. Small pericardial effusion. Hepatobiliary: Heterogeneous enhancement of the liver. Pericholecystic fluid noted. No visible stones or biliary ductal dilatation. Pancreas: No focal abnormality or ductal dilatation. Spleen: No focal abnormality.  Normal size. Adrenals/Urinary Tract: 2.5 cm cyst in the upper pole of the right kidney. No follow-up imaging recommended. No stones or hydronephrosis. Adrenal glands and urinary bladder unremarkable. Stomach/Bowel: Stomach, large and small bowel grossly unremarkable. Vascular/Lymphatic: Aortic atherosclerosis. No evidence of aneurysm or adenopathy. Reproductive: Enhanced seen area centrally within the uterus measures 1.4 cm. This could reflect a submucosal fibroid. Cannot exclude endometrial polyp or other endometrial lesion. No adnexal mass. Other: No free fluid or free air. Diffuse edema throughout the abdominal wall compatible with anasarca. Musculoskeletal: No acute bony abnormality. IMPRESSION: Heterogeneous enhancement throughout the liver. Recommend correlation with LFTs to exclude cirrhosis  or hepatitis. Small right pleural effusion. Right lower lobe atelectasis or infiltrate. 1.4 cm enhanced seen nodule centrally within the uterus which could  reflect a submucosal fibroid or endometrial polyp. Consider further evaluation with elective pelvic ultrasound. Diffuse anasarca throughout the abdominal wall. Aortic atherosclerosis. Electronically Signed   By: Rolm Baptise M.D.   On: 04/14/2022 00:45   CT Head Wo Contrast  Result Date: 04/14/2022 CLINICAL DATA:  Altered mental status EXAM: CT HEAD WITHOUT CONTRAST TECHNIQUE: Contiguous axial images were obtained from the base of the skull through the vertex without intravenous contrast. RADIATION DOSE REDUCTION: This exam was performed according to the departmental dose-optimization program which includes automated exposure control, adjustment of the mA and/or kV according to patient size and/or use of iterative reconstruction technique. COMPARISON:  None Available. FINDINGS: Brain: No acute intracranial abnormality. Specifically, no hemorrhage, hydrocephalus, mass lesion, acute infarction, or significant intracranial injury. Vascular: No hyperdense vessel or unexpected calcification. Skull: No acute calvarial abnormality. Sinuses/Orbits: No acute findings Other: None IMPRESSION: No acute intracranial abnormality. Electronically Signed   By: Rolm Baptise M.D.   On: 04/14/2022 00:41   DG Chest Port 1 View  Result Date: 04/13/2022 CLINICAL DATA:  Cough. EXAM: PORTABLE CHEST 1 VIEW COMPARISON:  None Available. FINDINGS: The heart is enlarged and the mediastinal contour is within normal limits. The pulmonary vasculature is distended. Atherosclerotic calcification of the aorta is noted. Hazy opacities are noted in the mid to lower lung field on the right. The left lung is clear. No pneumothorax. No acute osseous abnormality. IMPRESSION: 1. Cardiomegaly with pulmonary vascular congestion. 2. Hazy opacities in the mid to lower lung field on the right, possible layering pleural effusion versus edema or infiltrate. Electronically Signed   By: Brett Fairy M.D.   On: 04/13/2022 21:37        Scheduled  Meds:  enoxaparin (LOVENOX) injection  40 mg Subcutaneous Q24H   furosemide  40 mg Intravenous BID   insulin aspart  0-9 Units Subcutaneous Q4H   Continuous Infusions:   LOS: 0 days    Time spent: 53 minutes spent on chart review, discussion with nursing staff, consultants, updating family and interview/physical exam; more than 50% of that time was spent in counseling and/or coordination of care.    Demyah Smyre J [place] DO [facility] Available via Epic secure chat 7am-7pm After these hours, please refer to coverage provider listed on [email or web address] 04/14/2022, 12:59 PM

## 2022-04-15 ENCOUNTER — Inpatient Hospital Stay (HOSPITAL_COMMUNITY): Payer: Medicaid Other

## 2022-04-15 DIAGNOSIS — J9601 Acute respiratory failure with hypoxia: Secondary | ICD-10-CM | POA: Diagnosis not present

## 2022-04-15 DIAGNOSIS — I5021 Acute systolic (congestive) heart failure: Secondary | ICD-10-CM

## 2022-04-15 LAB — GASTROINTESTINAL PANEL BY PCR, STOOL (REPLACES STOOL CULTURE)

## 2022-04-15 LAB — BASIC METABOLIC PANEL
Anion gap: 5 (ref 5–15)
BUN: 10 mg/dL (ref 8–23)
CO2: 42 mmol/L — ABNORMAL HIGH (ref 22–32)
Calcium: 8.5 mg/dL — ABNORMAL LOW (ref 8.9–10.3)
Chloride: 93 mmol/L — ABNORMAL LOW (ref 98–111)
Creatinine, Ser: 0.57 mg/dL (ref 0.44–1.00)
GFR, Estimated: >60 mL/min (ref >60)
Glucose, Bld: 99 mg/dL (ref 70–99)
Potassium: 3.9 mmol/L (ref 3.5–5.1)
Sodium: 140 mmol/L (ref 135–145)

## 2022-04-15 LAB — ECHOCARDIOGRAM COMPLETE
Area-P 1/2: 3.01 cm2
Height: 64 in
MV VTI: 3.56 cm2
S' Lateral: 3 cm
Weight: 4994.74 oz

## 2022-04-15 LAB — URINE CULTURE: Culture: NO GROWTH

## 2022-04-15 LAB — BLOOD GAS, VENOUS
Acid-Base Excess: 18.4 mmol/L — ABNORMAL HIGH (ref 0.0–2.0)
Bicarbonate: 47.8 mmol/L — ABNORMAL HIGH (ref 20.0–28.0)
O2 Saturation: 93.4 %
Patient temperature: 37.1
pCO2, Ven: 79 mmHg (ref 44–60)
pH, Ven: 7.39 (ref 7.25–7.43)
pO2, Ven: 67 mmHg — ABNORMAL HIGH (ref 32–45)

## 2022-04-15 LAB — GLUCOSE, CAPILLARY
Glucose-Capillary: 103 mg/dL — ABNORMAL HIGH (ref 70–99)
Glucose-Capillary: 100 mg/dL — ABNORMAL HIGH (ref 70–99)
Glucose-Capillary: 139 mg/dL — ABNORMAL HIGH (ref 70–99)
Glucose-Capillary: 124 mg/dL — ABNORMAL HIGH (ref 70–99)

## 2022-04-15 LAB — BRAIN NATRIURETIC PEPTIDE: B Natriuretic Peptide: 749.1 pg/mL — ABNORMAL HIGH (ref 0.0–100.0)

## 2022-04-15 MED ORDER — NICOTINE 21 MG/24HR TD PT24
21.0000 mg | MEDICATED_PATCH | Freq: Every day | TRANSDERMAL | Status: DC
  Administered 2022-04-15 – 2022-04-26 (×12): 21 mg via TRANSDERMAL
  Filled 2022-04-15 (×12): qty 1

## 2022-04-15 MED ORDER — ALBUTEROL SULFATE (2.5 MG/3ML) 0.083% IN NEBU: 2.5000 mg | INHALATION_SOLUTION | Freq: Four times a day (QID) | RESPIRATORY_TRACT | Status: DC | PRN

## 2022-04-15 MED ORDER — ORAL CARE MOUTH RINSE: 15.0000 mL | OROMUCOSAL | Status: DC | PRN

## 2022-04-15 MED ORDER — LORATADINE 10 MG PO TABS
10.0000 mg | ORAL_TABLET | Freq: Every day | ORAL | Status: DC
  Administered 2022-04-15 – 2022-04-26 (×12): 10 mg via ORAL
  Filled 2022-04-15 (×12): qty 1

## 2022-04-15 MED ORDER — DIVALPROEX SODIUM 250 MG PO DR TAB
500.0000 mg | DELAYED_RELEASE_TABLET | Freq: Every day | ORAL | Status: DC
  Administered 2022-04-15 – 2022-04-26 (×12): 500 mg via ORAL
  Filled 2022-04-15 (×12): qty 2

## 2022-04-15 MED ORDER — VENLAFAXINE HCL ER 75 MG PO CP24
75.0000 mg | ORAL_CAPSULE | Freq: Every day | ORAL | Status: DC
  Administered 2022-04-16 – 2022-04-26 (×11): 75 mg via ORAL
  Filled 2022-04-15 (×11): qty 1

## 2022-04-15 NOTE — Progress Notes (Signed)
Patient wanted off Bipap

## 2022-04-15 NOTE — Progress Notes (Signed)
Critical venous blood gad CO2 level 79. MD [place] paged to be made aware, awaiting new orders.

## 2022-04-15 NOTE — Progress Notes (Signed)
  Echocardiogram 2D Echocardiogram has been performed.  Darlina Sicilian M 04/15/2022, 3:16 PM

## 2022-04-15 NOTE — Progress Notes (Signed)
PROGRESS NOTE    Ellen ShiGladys E Acevedo  ZOX:[id number] DOB: 1961-06-10 DOA: 04/13/2022 PCP: System, Provider Not In    Brief Narrative:   Ellen Acevedo is a 61 y.o. female with past medical history significant for schizophrenia, type 2 diabetes mellitus, essential hypertension, hyperlipidemia and reported "heart problems" but no documented past medical history who presented to [facility] ED on 10/14 via EMS from home for evaluation of diarrhea, confusion, cough and shortness of breath.  The [employer] was called for a wellness check and patient was found at home with bedbugs and roaches.  Patient reports diarrhea over the last 3 weeks with mild generalized abdominal pain.  Also with shortness of breath, bilateral lower extremity edema for several months.  Denies chest pain.  Denies any recent dietary changes.  Cannot recall what medications she takes at baseline.  In the ED, temperature 98.1 F, HR 106, RR 23, BP 159/1 1, SPO2 100% on 4 L nasal cannula.  VBG with pH 7.35, PCO2 80, PO2 54.  Sodium 138, potassium 4.6, chloride 96, CO2 35, glucose 79, BUN 6, creatinine 0.58.  AST 16, ALT 11, total bilirubin 1.3.  BNP 973.3.  High sensitive troponin 19> 18.  Lactic acid 2.2.  WBC 7.2, hemoglobin 13.0, platelet 185.  COVID-19 PCR negative parainfluenza A/B PCR negative.  Urinalysis with moderate leukocytes, negative nitrite, no bacteria, 0-5 WBCs.  CT head without contrast with no acute intracranial abnormality.  CT angiogram chest with no evidence of pulmonary embolism, CAD and mild cardiomegaly, bilateral effusions, right greater than left, right upper lobe and right lower lobe airspace opacities could reflect pneumonia versus compressive atelectasis.  CT abdomen/pelvis with heterogeneous enhancement throughout the liver, small right pleural effusion, right lower lobe atelectasis versus infiltrate, 1.4 cm enhancing nodule centrally within the uterus could reflect a submucosal fibroid or endometrial polyp,  diffuse anasarca throughout the abdominal wall, aortic atherosclerosis.  Blood cultures and urine cultures obtained.  Patient was given vancomycin, cefepime, metronidazole and 1 L NS bolus.  Patient was placed on BiPAP.  TRH consulted for further evaluation management of acute metabolic encephalopathy, acute hypoxic/hypercapnic respiratory failure.  Assessment & Plan:   Acute metabolic encephalopathy: Improving Patient presenting via EMS with confusion, likely secondary to hypercapnic and hypoxic respiratory failure.  Patient is afebrile without leukocytosis.  CT head with no acute findings.  No focal neurological deficit. --Supportive care, treatment as below  Acute hypoxic/hypercapnic respite failure, POA Acute congestive heart failure exacerbation, undifferentiated Patient was noted to be hypoxic with SPO2 84% on room air and placed on supplemental oxygen.  On arrival to the ED VBG notable for elevated CO2 level of 80.  BNP elevated at 973.3.  CT angiogram chest negative for PE, questionable lung opacity likely related to atelectasis as patient afebrile without leukocytosis.  Procalciton within normal limits.  Etiology likely multifactorial, in the setting of morbid obesity and likely undiagnosed CHF. --TTE: Pending -- Furosemide 40 mg IV q12h -- Heart healthy diet, fluid restriction 1800 mL daily -- Continue BiPAP as needed while napping and nightly -- Titrate supplemental oxygen to maintain SPO2 greater than 92% -- Strict I's and O's and daily weights -- Repeat BNP, VBG in the a.m.  Diarrhea Patient is afebrile without leukocytosis, CT abdomen/pelvis without evidence of colitis.  Procalcitonin negative. -- GI PCR panel: Pending -- Continue enteric precautions for now  CAD Noted on CT chest.  Troponin slightly elevated at 19 followed by 18; stable.  Denies chest pain.  Not consistent  with ACS.  EKG with no concerning dynamic changes. --Continue to monitor on telemetry  Fatty liver  disease CT notable for heterogeneous enhancement throughout the liver.  LFTs within normal limits.  Right upper quadrant ultrasound with mild increased echogenicity throughout the liver without focal lesion suggestive of hepatic steatosis, pericholecystic fluid without evidence for cholelithiasis, gallbladder not distended.  Uterine abnormality CT abdomen/pelvis with 1.4 cm enhancing nodule centrally within the uterus could reflect a submucosal fibroid or endometrial polyp.  Pelvic ultrasound Limited examination due to morbid obesity but with endometrial thickness considered abnormal for asymptomatic postmenopausal result patient with recommendation of endometrial sampling to exclude carcinoma.  Outpatient follow-up with GYN.  Type 2 diabetes mellitus Hemoglobin A1c 6.5.  Patient does not recall her medications he takes and continue to await pharmacy reconciliation. --SSI for coverage --CBGs qAC/HS  Essential hypertension Patient does not recall her home medications.  BP 141/83 -- Hydralazine 25 mg p.o. q8h PRN SBP >165 or DBP >110  Hyperlipidemia Lipid panel total cholesterol 161, HDL 29, LDL 109, triglycerides 113.  History of reported schizophrenia Patient does not recall her medications, pending medication reconciliation  Weakness/debility/deconditioning: -- PT/OT evaluation: Pending    DVT prophylaxis: enoxaparin (LOVENOX) injection 40 mg Start: 04/14/22 2200    Code Status: Full Code Family Communication: No family present at bedside this morning  Disposition Plan:  Level of care: Progressive Status is: Inpatient Remains inpatient appropriate because: IV diuresis, requiring BiPAP, anticipate likely need of placement    Consultants:  None  Procedures:  Pelvic ultrasound Right upper quadrant ultrasound TTE: Pending  Antimicrobials:  Vancomycin 10/14 - 10/14 Cefepime 10/14 - 10/14 Metronidazole 10/14 - 10/14   Subjective: Patient seen examined bedside, resting  comfortably.  Lying in bed.  Did wear BiPAP for few hours overnight.  More alert and oriented this morning.  No further diarrhea reported.  Awaiting pharmacy medication reconciliation as patient cannot recall her home medications she typically takes.  Also awaiting TTE and therapy evaluation.  Anticipate likely need of placement.  No other specific complaints or concerns at this time.  Denies headache, no dizziness, no chest pain, no abdominal pain, no nausea/vomiting, no current diarrhea, no fever/chills/night sweats, no focal weakness, no paresthesias.  No acute events overnight per nursing staff.  Objective: Vitals:   04/15/22 0500 04/15/22 0542 04/15/22 0740 04/15/22 0915  BP:  (!) 141/83    Pulse:  (!) 112 (!) 109   Resp:  19    Temp:  98.7 F (37.1 C) 98.7 F (37.1 C)   TempSrc:   Oral   SpO2:  98% 95% 93%  Weight: (!) 141.6 kg     Height:        Intake/Output Summary (Last 24 hours) at 04/15/2022 1027 Last data filed at 04/15/2022 0943 Gross per 24 hour  Intake 717 ml  Output 3200 ml  Net -2483 ml   Filed Weights   04/13/22 1649 04/15/22 0500  Weight: 108.9 kg (!) 141.6 kg    Examination:  Physical Exam: GEN: NAD, alert, on BiPAP, morbidly obese, appears older than stated age, chronically ill in appearance HEENT: NCAT, PERRL, EOMI, sclera clear, MMM PULM: Diminished breath sounds bilateral bases, no wheezing/crackles, normal respiratory effort without accessory muscle use, on 5 L nasal cannula with SPO2 95% at rest CV: RRR w/o M/G/R GI: abd soft, NTND, NABS, no R/G/M MSK: Chronic venous changes bilateral lower extremities with 2-3+ pitting edema to upper shin, moves all extremities independently  NEURO: CN II-XII  intact, no focal deficits, sensation to light touch intact PSYCH: normal mood/affect Integumentary: Chronic venous stasis changes bilateral lower extremities, otherwise no other concerning rashes/lesions/wounds appreciated on exposed skin surfaces.       Data Reviewed: I have personally reviewed following labs and imaging studies  CBC: Recent Labs  Lab 04/13/22 2045  WBC 7.2  NEUTROABS 3.9  HGB 13.0  HCT 45.8  MCV 85.0  PLT 185   Basic Metabolic Panel: Recent Labs  Lab 04/13/22 1900 04/15/22 0509  NA 138 140  K 4.6 3.9  CL 96* 93*  CO2 35* 42*  GLUCOSE 79 99  BUN 6* 10  CREATININE 0.58 0.57  CALCIUM 8.9 8.5*   GFR: Estimated Creatinine Clearance: 104.3 mL/min (by C-G formula based on SCr of 0.57 mg/dL). Liver Function Tests: Recent Labs  Lab 04/13/22 1900  AST 16  ALT 11  ALKPHOS 97  BILITOT 1.3*  PROT 7.2  ALBUMIN 3.1*   No results for input(s): "LIPASE", "AMYLASE" in the last 168 hours. No results for input(s): "AMMONIA" in the last 168 hours. Coagulation Profile: No results for input(s): "INR", "PROTIME" in the last 168 hours. Cardiac Enzymes: No results for input(s): "CKTOTAL", "CKMB", "CKMBINDEX", "TROPONINI" in the last 168 hours. BNP (last 3 results) No results for input(s): "PROBNP" in the last 8760 hours. HbA1C: Recent Labs    04/14/22 0902  HGBA1C 6.5*   CBG: Recent Labs  Lab 04/14/22 0815 04/14/22 1302 04/14/22 1626 04/14/22 2049 04/15/22 0818  GLUCAP 71 66* 98 111* 103*   Lipid Profile: Recent Labs    04/14/22 1430  CHOL 161  HDL 29*  LDLCALC 109*  TRIG 113  CHOLHDL 5.6   Thyroid Function Tests: No results for input(s): "TSH", "T4TOTAL", "FREET4", "T3FREE", "THYROIDAB" in the last 72 hours. Anemia Panel: No results for input(s): "VITAMINB12", "FOLATE", "FERRITIN", "TIBC", "IRON", "RETICCTPCT" in the last 72 hours. Sepsis Labs: Recent Labs  Lab 04/13/22 1900 04/14/22 0305 04/14/22 0929  PROCALCITON  --   --  <0.10  LATICACIDVEN 2.2* 0.7  --     Recent Results (from the past 240 hour(s))  Culture, blood (Routine X 2) w Reflex to ID Panel     Status: None (Preliminary result)   Collection Time: 04/13/22  6:25 PM   Specimen: Right Antecubital; Blood  Result  Value Ref Range Status   Specimen Description   Final    RIGHT ANTECUBITAL BLOOD Performed at [facility], [address], [place], [place] [address]    Special Requests   Final    BOTTLES DRAWN AEROBIC AND ANAEROBIC Blood Culture adequate volume Performed at [facility], [address], [place], [place] [address]    Culture   Final    NO GROWTH 2 DAYS Performed at [facility], [address], [place], [place] [address]    Report Status PENDING  Incomplete  Culture, blood (Routine X 2) w Reflex to ID Panel     Status: None (Preliminary result)   Collection Time: 04/13/22  8:45 PM   Specimen: Left Antecubital; Blood  Result Value Ref Range Status   Specimen Description   Final    LEFT ANTECUBITAL BLOOD Performed at [facility], [address] [place], [place] [address]    Special Requests   Final    BOTTLES DRAWN AEROBIC AND ANAEROBIC Blood Culture results may not be optimal due to an excessive volume of blood received in culture bottles Performed at [facility], [address] [place], [place] [address]    Culture   Final    NO GROWTH 1 DAY Performed at [facility], [address] [place], [place] [address]    Report Status PENDING  Incomplete  Resp Panel by RT-PCR (Flu A&B, Covid) Anterior Nasal Swab     Status: None   Collection Time: 04/13/22  8:50 PM   Specimen: Anterior Nasal Swab  Result Value Ref Range Status   SARS Coronavirus 2 by RT PCR NEGATIVE NEGATIVE Final    Comment: (NOTE) SARS-CoV-2 target nucleic acids are NOT DETECTED.  The SARS-CoV-2 RNA is generally detectable in upper respiratory specimens during the acute phase of infection. The lowest concentration of SARS-CoV-2 viral copies this assay can detect is 138 copies/mL. A negative result does not preclude SARS-Cov-2 infection and should not be used as the sole basis for treatment or other patient management decisions. A negative result  may occur with  improper specimen collection/handling, submission of specimen other than nasopharyngeal swab, presence of viral mutation(s) within the areas targeted by this assay, and inadequate number of viral copies(<138 copies/mL). A negative result must be combined with clinical observations, patient history, and epidemiological information. The expected result is Negative.  Fact Sheet for Patients:  BloggerCourse.com  Fact Sheet for Healthcare Providers:  SeriousBroker.it  This test is no t yet approved or cleared by the [place] FDA and  has been authorized for detection and/or diagnosis of SARS-CoV-2 by FDA under an Emergency Use Authorization (EUA). This EUA will remain  in effect (meaning this test can be used) for the duration of the COVID-19 declaration under Section 564(b)(1) of the Act, 21 U.S.C.section 360bbb-3(b)(1), unless the authorization is terminated  or revoked sooner.       Influenza A by PCR NEGATIVE NEGATIVE Final   Influenza B by PCR NEGATIVE NEGATIVE Final    Comment: (NOTE) The Xpert Xpress SARS-CoV-2/FLU/RSV plus assay is intended as an aid in the diagnosis of influenza from Nasopharyngeal swab specimens and should not be used as a sole basis for treatment. Nasal washings and aspirates are unacceptable for Xpert Xpress SARS-CoV-2/FLU/RSV testing.  Fact Sheet for Patients: BloggerCourse.com  Fact Sheet for Healthcare Providers: SeriousBroker.it  This test is not yet approved or cleared by the [place] FDA and has been authorized for detection and/or diagnosis of SARS-CoV-2 by FDA under an Emergency Use Authorization (EUA). This EUA will remain in effect (meaning this test can be used) for the duration of the COVID-19 declaration under Section 564(b)(1) of the Act, 21 U.S.C. section 360bbb-3(b)(1), unless the authorization is terminated  or revoked.  Performed at [facility], [address], [place], [place] [address]          Radiology Studies: US PELVIC COMPLETE WITH TRANSVAGINAL  Result Date: 04/14/2022 CLINICAL DATA:  61 year old female history of enhancing lesion in the uterus on prior CT examination. Follow-up study. EXAM: TRANSABDOMINAL AND TRANSVAGINAL ULTRASOUND OF PELVIS DOPPLER ULTRASOUND OF OVARIES TECHNIQUE: Both transabdominal and transvaginal ultrasound examinations of the pelvis were performed. Transabdominal technique was performed for global imaging of the pelvis including uterus, ovaries, adnexal regions, and pelvic cul-de-sac. It was necessary to proceed with endovaginal exam following the transabdominal exam to visualize the adnexal regions. Color and duplex Doppler ultrasound was utilized to evaluate blood flow to the ovaries. COMPARISON:  Pelvic ultrasound 10/09/2004. FINDINGS: Comment: Today's study was severely limited by patient's morbid obesity. Uterus Measurements: 10.7 x 4.3 x 4.5 cm =  volume: 108.8 mL. No fibroids or other mass visualized. Endometrium Thickness: 13 mm. Poorly demonstrated on today's suboptimal examination. Right ovary Could not be visualized. Left ovary Could not be visualized. Other findings Trace volume of free fluid in the cul-de-sac. IMPRESSION: 1. Exceedingly limited examination secondary to patient's morbid obesity. Endometrial thickness is considered abnormal for an asymptomatic post-menopausal female. Endometrial sampling should be considered to exclude carcinoma. Electronically Signed   By: Trudie Reed M.D.   On: 04/14/2022 08:57   US Abdomen Limited RUQ (LIVER/GB)  Result Date: 04/14/2022 CLINICAL DATA:  Abnormal CT scan.  Possible cirrhosis. EXAM: ULTRASOUND ABDOMEN LIMITED RIGHT UPPER QUADRANT COMPARISON:  CT abdomen and pelvis 04/14/22 at 12:03 a.m. FINDINGS: Gallbladder: The gallbladder is not distended. Wall thickness measures 2 mm.  Pericholecystic fluid is noted. Common bile duct: Diameter: 3.0 mm, within normal limits. Liver: Mild increased echogenicity is present throughout the liver. No discrete lesions are present. Portal vein is patent on color Doppler imaging with normal direction of blood flow towards the liver. Other: Right pleural effusion noted. IMPRESSION: 1. Mild increased echogenicity throughout the liver without focal lesion. This suggests hepatic steatosis. 2. Pericholecystic fluid without evidence for cholelithiasis. Gallbladder is not distended. Fluid likely related to overall anasarca. 3. Right pleural effusion. Electronically Signed   By: Marin Roberts M.D.   On: 04/14/2022 08:37   CT Angio Chest PE W/Cm &/Or Wo Cm  Result Date: 04/14/2022 CLINICAL DATA:  Altered mental status.  Shortness of breath. EXAM: CT ANGIOGRAPHY CHEST WITH CONTRAST TECHNIQUE: Multidetector CT imaging of the chest was performed using the standard protocol during bolus administration of intravenous contrast. Multiplanar CT image reconstructions and MIPs were obtained to evaluate the vascular anatomy. RADIATION DOSE REDUCTION: This exam was performed according to the departmental dose-optimization program which includes automated exposure control, adjustment of the mA and/or kV according to patient size and/or use of iterative reconstruction technique. CONTRAST:  OMNIPAQUE IOHEXOL 350 MG/ML SOLN COMPARISON:  None Available. FINDINGS: Cardiovascular: No filling defects in the pulmonary arteries to suggest pulmonary emboli. Mild cardiomegaly. Small pericardial effusion. Scattered coronary artery and aortic calcifications. No evidence of aortic aneurysm. Mediastinum/Nodes: No mediastinal, hilar, or axillary adenopathy. Trachea and esophagus are unremarkable. Thyroid unremarkable. Lungs/Pleura: Moderate right pleural effusion and small left pleural effusion. Dependent airspace disease in the right upper lobe and consolidation in the right  lower lobe. This could reflect pneumonia or compressive atelectasis. Left base atelectasis. Upper Abdomen: See abdominal CT report Musculoskeletal: Chest wall soft tissues are unremarkable. No acute bony abnormality. Review of the MIP images confirms the above findings. IMPRESSION: No evidence of pulmonary embolus. Coronary artery disease, mild cardiomegaly. Bilateral effusions, right greater than left. Right upper lobe and right lower lobe airspace opacities could reflect pneumonia or compressive atelectasis. Aortic Atherosclerosis (ICD10-I70.0). Electronically Signed   By: Charlett Nose M.D.   On: 04/14/2022 00:48   CT Abdomen Pelvis W Contrast  Result Date: 04/14/2022 CLINICAL DATA:  Abdominal pain EXAM: CT ABDOMEN AND PELVIS WITH CONTRAST TECHNIQUE: Multidetector CT imaging of the abdomen and pelvis was performed using the standard protocol following bolus administration of intravenous contrast. RADIATION DOSE REDUCTION: This exam was performed according to the departmental dose-optimization program which includes automated exposure control, adjustment of the mA and/or kV according to patient size and/or use of iterative reconstruction technique. CONTRAST:  OMNIPAQUE IOHEXOL 350 MG/ML SOLN COMPARISON:  None Available. FINDINGS: Lower chest: Small right pleural effusion. Right lower lobe atelectasis or infiltrate. Minimal left base  atelectasis. Small pericardial effusion. Hepatobiliary: Heterogeneous enhancement of the liver. Pericholecystic fluid noted. No visible stones or biliary ductal dilatation. Pancreas: No focal abnormality or ductal dilatation. Spleen: No focal abnormality.  Normal size. Adrenals/Urinary Tract: 2.5 cm cyst in the upper pole of the right kidney. No follow-up imaging recommended. No stones or hydronephrosis. Adrenal glands and urinary bladder unremarkable. Stomach/Bowel: Stomach, large and small bowel grossly unremarkable. Vascular/Lymphatic: Aortic atherosclerosis. No evidence  of aneurysm or adenopathy. Reproductive: Enhanced seen area centrally within the uterus measures 1.4 cm. This could reflect a submucosal fibroid. Cannot exclude endometrial polyp or other endometrial lesion. No adnexal mass. Other: No free fluid or free air. Diffuse edema throughout the abdominal wall compatible with anasarca. Musculoskeletal: No acute bony abnormality. IMPRESSION: Heterogeneous enhancement throughout the liver. Recommend correlation with LFTs to exclude cirrhosis or hepatitis. Small right pleural effusion. Right lower lobe atelectasis or infiltrate. 1.4 cm enhanced seen nodule centrally within the uterus which could reflect a submucosal fibroid or endometrial polyp. Consider further evaluation with elective pelvic ultrasound. Diffuse anasarca throughout the abdominal wall. Aortic atherosclerosis. Electronically Signed   By: Rolm Baptise M.D.   On: 04/14/2022 00:45   CT Head Wo Contrast  Result Date: 04/14/2022 CLINICAL DATA:  Altered mental status EXAM: CT HEAD WITHOUT CONTRAST TECHNIQUE: Contiguous axial images were obtained from the base of the skull through the vertex without intravenous contrast. RADIATION DOSE REDUCTION: This exam was performed according to the departmental dose-optimization program which includes automated exposure control, adjustment of the mA and/or kV according to patient size and/or use of iterative reconstruction technique. COMPARISON:  None Available. FINDINGS: Brain: No acute intracranial abnormality. Specifically, no hemorrhage, hydrocephalus, mass lesion, acute infarction, or significant intracranial injury. Vascular: No hyperdense vessel or unexpected calcification. Skull: No acute calvarial abnormality. Sinuses/Orbits: No acute findings Other: None IMPRESSION: No acute intracranial abnormality. Electronically Signed   By: Rolm Baptise M.D.   On: 04/14/2022 00:41   DG Chest Port 1 View  Result Date: 04/13/2022 CLINICAL DATA:  Cough. EXAM: PORTABLE CHEST 1  VIEW COMPARISON:  None Available. FINDINGS: The heart is enlarged and the mediastinal contour is within normal limits. The pulmonary vasculature is distended. Atherosclerotic calcification of the aorta is noted. Hazy opacities are noted in the mid to lower lung field on the right. The left lung is clear. No pneumothorax. No acute osseous abnormality. IMPRESSION: 1. Cardiomegaly with pulmonary vascular congestion. 2. Hazy opacities in the mid to lower lung field on the right, possible layering pleural effusion versus edema or infiltrate. Electronically Signed   By: Brett Fairy M.D.   On: 04/13/2022 21:37        Scheduled Meds:  enoxaparin (LOVENOX) injection  40 mg Subcutaneous Q24H   furosemide  40 mg Intravenous BID   insulin aspart  0-9 Units Subcutaneous TID WC   Continuous Infusions:   LOS: 1 day    Time spent: 51 minutes spent on chart review, discussion with nursing staff, consultants, updating family and interview/physical exam; more than 50% of that time was spent in counseling and/or coordination of care.    Benny Deutschman J [place] DO [facility] Available via Epic secure chat 7am-7pm After these hours, please refer to coverage provider listed on [email or web address] 04/15/2022, 10:27 AM
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
## 2022-04-15 NOTE — Progress Notes (Signed)
Echocardiogram not complete due to personal care. Will re-attempt as the schedule permits.   Kaoru Benda RDCS 

## 2022-04-15 NOTE — Progress Notes (Signed)
Patient's family (sister and brother) requesting to meet with MD [place] in AM. MD to meet family at 27 AM. Family provided a list of patient's medications- list provided for MD [place] Family also states patient receives a monthly injection for psych, unsure of medication name.   MD [place] also made aware GI Panel was + Campylobacter. Pt remains on Enteric precautions.
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
## 2022-04-16 DIAGNOSIS — J9601 Acute respiratory failure with hypoxia: Secondary | ICD-10-CM | POA: Diagnosis not present

## 2022-04-16 LAB — BLOOD GAS, VENOUS
Acid-Base Excess: 25 mmol/L — ABNORMAL HIGH (ref 0.0–2.0)
Bicarbonate: 54.1 mmol/L — ABNORMAL HIGH (ref 20.0–28.0)
O2 Saturation: 93.5 %
Patient temperature: 37
pCO2, Ven: 76 mmHg (ref 44–60)
pH, Ven: 7.46 — ABNORMAL HIGH (ref 7.25–7.43)
pO2, Ven: 64 mmHg — ABNORMAL HIGH (ref 32–45)

## 2022-04-16 LAB — BASIC METABOLIC PANEL
Anion gap: 7 (ref 5–15)
BUN: 12 mg/dL (ref 8–23)
CO2: 41 mmol/L — ABNORMAL HIGH (ref 22–32)
Calcium: 8.4 mg/dL — ABNORMAL LOW (ref 8.9–10.3)
Chloride: 90 mmol/L — ABNORMAL LOW (ref 98–111)
Creatinine, Ser: 0.5 mg/dL (ref 0.44–1.00)
GFR, Estimated: >60 mL/min (ref >60)
Glucose, Bld: 81 mg/dL (ref 70–99)
Potassium: 3.3 mmol/L — ABNORMAL LOW (ref 3.5–5.1)
Sodium: 138 mmol/L (ref 135–145)

## 2022-04-16 LAB — BRAIN NATRIURETIC PEPTIDE: B Natriuretic Peptide: 580.9 pg/mL — ABNORMAL HIGH (ref 0.0–100.0)

## 2022-04-16 LAB — GLUCOSE, CAPILLARY
Glucose-Capillary: 100 mg/dL — ABNORMAL HIGH (ref 70–99)
Glucose-Capillary: 110 mg/dL — ABNORMAL HIGH (ref 70–99)
Glucose-Capillary: 115 mg/dL — ABNORMAL HIGH (ref 70–99)
Glucose-Capillary: 141 mg/dL — ABNORMAL HIGH (ref 70–99)

## 2022-04-16 MED ORDER — ZINC OXIDE 40 % EX OINT
TOPICAL_OINTMENT | Freq: Two times a day (BID) | CUTANEOUS | Status: DC
  Administered 2022-04-19 – 2022-04-25 (×4): 1 via TOPICAL
  Filled 2022-04-16 (×3): qty 57

## 2022-04-16 MED ORDER — POTASSIUM CHLORIDE CRYS ER 20 MEQ PO TBCR
40.0000 meq | EXTENDED_RELEASE_TABLET | ORAL | Status: AC
  Administered 2022-04-16 (×2): 40 meq via ORAL
  Filled 2022-04-16 (×2): qty 2

## 2022-04-16 NOTE — Progress Notes (Signed)
PROGRESS NOTE    Ellen Acevedo  TMH:[id number] DOB: May 02, 1961 DOA: 04/13/2022 PCP: System, Provider Not In    Brief Narrative:   Ellen Acevedo is a 61 y.o. female with past medical history significant for schizophrenia, type 2 diabetes mellitus, essential hypertension, hyperlipidemia and reported "heart problems" but no documented past medical history who presented to [facility] ED on 10/14 via EMS from home for evaluation of diarrhea, confusion, cough and shortness of breath.  The [employer] Department was called for a wellness check and patient was found at home with bedbugs and roaches.  Patient reports diarrhea over the last 3 weeks with mild generalized abdominal pain.  Also with shortness of breath, bilateral lower extremity edema for several months.  Denies chest pain.  Denies any recent dietary changes.  Cannot recall what medications she takes at baseline.  In the ED, temperature 98.1 F, HR 106, RR 23, BP 159/1 1, SPO2 100% on 4 L nasal cannula.  VBG with pH 7.35, PCO2 80, PO2 54.  Sodium 138, potassium 4.6, chloride 96, CO2 35, glucose 79, BUN 6, creatinine 0.58.  AST 16, ALT 11, total bilirubin 1.3.  BNP 973.3.  High sensitive troponin 19> 18.  Lactic acid 2.2.  WBC 7.2, hemoglobin 13.0, platelet 185.  COVID-19 PCR negative parainfluenza A/B PCR negative.  Urinalysis with moderate leukocytes, negative nitrite, no bacteria, 0-5 WBCs.  CT head without contrast with no acute intracranial abnormality.  CT angiogram chest with no evidence of pulmonary embolism, CAD and mild cardiomegaly, bilateral effusions, right greater than left, right upper lobe and right lower lobe airspace opacities could reflect pneumonia versus compressive atelectasis.  CT abdomen/pelvis with heterogeneous enhancement throughout the liver, small right pleural effusion, right lower lobe atelectasis versus infiltrate, 1.4 cm enhancing nodule centrally within the uterus could reflect a submucosal fibroid or endometrial polyp,  diffuse anasarca throughout the abdominal wall, aortic atherosclerosis.  Blood cultures and urine cultures obtained.  Patient was given vancomycin, cefepime, metronidazole and 1 L NS bolus.  Patient was placed on BiPAP.  TRH consulted for further evaluation management of acute metabolic encephalopathy, acute hypoxic/hypercapnic respiratory failure.  Assessment & Plan:   Acute metabolic encephalopathy: Improving Patient presenting via EMS with confusion, likely secondary to hypercapnic and hypoxic respiratory failure.  Patient is afebrile without leukocytosis.  CT head with no acute findings.  No focal neurological deficit. --Supportive care, treatment as below  Acute hypoxic/hypercapnic respite failure, POA Acute congestive heart failure exacerbation, undifferentiated Patient was noted to be hypoxic with SPO2 84% on room air and placed on supplemental oxygen.  On arrival to the ED VBG notable for elevated CO2 level of 80.  BNP elevated at 973.3.  CT angiogram chest negative for PE, questionable lung opacity likely related to atelectasis as patient afebrile without leukocytosis.  Procalciton within normal limits.  Etiology likely multifactorial, in the setting of morbid obesity and likely undiagnosed CHF.  TTE with LVEF 55-60%, mild concentric LVH, indeterminant diastolic function due to severe MAC, moderate-sized pericardial effusion circumferential without tamponade, trivial MR, IVC dilated. -- Furosemide 40 mg IV q12h -- Heart healthy diet, fluid restriction 1800 mL daily -- Continue BiPAP as needed while napping and nightly -- Titrate supplemental oxygen to maintain SPO2 greater than 92% -- Strict I's and O's and daily weights -- ADAPT consulted for consideration of BiPAP versus trilogy vent on discharge; unable to perform inpatient PFTs per RT  Campylobacter gastroenteritis Patient is afebrile without leukocytosis, CT abdomen/pelvis without evidence of colitis.  Procalcitonin negative.  GI PCR  panel positive for Campylobacter.  Diarrhea now resolved and has formed stools.  Discontinued enteric precautions.  Supportive care.  CAD Noted on CT chest.  Troponin slightly elevated at 19 followed by 18; stable.  Denies chest pain.  Not consistent with ACS.  EKG with no concerning dynamic changes. --Continue to monitor on telemetry  Fatty liver disease CT notable for heterogeneous enhancement throughout the liver.  LFTs within normal limits.  Right upper quadrant ultrasound with mild increased echogenicity throughout the liver without focal lesion suggestive of hepatic steatosis, pericholecystic fluid without evidence for cholelithiasis, gallbladder not distended.  Uterine abnormality CT abdomen/pelvis with 1.4 cm enhancing nodule centrally within the uterus could reflect a submucosal fibroid or endometrial polyp.  Pelvic ultrasound Limited examination due to morbid obesity but with endometrial thickness considered abnormal for asymptomatic postmenopausal result patient with recommendation of endometrial sampling to exclude carcinoma.  Outpatient follow-up with GYN.  Type 2 diabetes mellitus Hemoglobin A1c 6.5.  Patient does not recall her medications she takes --SSI for coverage --CBGs qAC/HS  Essential hypertension Patient does not recall her home medications.  BP 141/83 -- Hydralazine 25 mg p.o. q8h PRN SBP >165 or DBP >110  Hyperlipidemia Lipid panel total cholesterol 161, HDL 29, LDL 109, triglycerides 113.  History of reported schizophrenia Patient does not recall her medications, pending medication reconciliation  Weakness/debility/deconditioning: PT/OT recommend SNF placement.  Continue therapy efforts while inpatient.  TOC for placement.  Social: Very poor living conditions, patient unable to take care of herself at baseline.  Family seeking guardianship.    DVT prophylaxis: enoxaparin (LOVENOX) injection 40 mg Start: 04/14/22 2200    Code Status: Full Code Family  Communication: Up to sisters present at bedside this morning  Disposition Plan:  Level of care: Progressive Status is: Inpatient Remains inpatient appropriate because: IV diuresis, requiring BiPAP, pending SNF placement    Consultants:  None  Procedures:  Pelvic ultrasound Right upper quadrant ultrasound TTE:  Antimicrobials:  Vancomycin 10/14 - 10/14 Cefepime 10/14 - 10/14 Metronidazole 10/14 - 10/14   Subjective: Patient seen examined bedside, resting comfortably.  Sitting in bedside chair.  No specific complaints this morning.  Now with formed stools.  Sisters updated at bedside; concerned about her living conditions and ability to care for herself and are currently seeking guardianship.  Family requesting to talk with social work today.  Remains on IV diuresis and pending SNF placement.  No other specific complaints or concerns at this time.  Denies headache, no dizziness, no chest pain, no abdominal pain, no nausea/vomiting, no current diarrhea, no fever/chills/night sweats, no focal weakness, no paresthesias.  No acute events overnight per nursing staff.  Objective: Vitals:   04/16/22 0315 04/16/22 0355 04/16/22 0500 04/16/22 0600  BP:  119/71    Pulse: 97 95    Resp: 18     Temp:  98.3 F (36.8 C)    TempSrc:  Axillary    SpO2: 95% 95% 95%   Weight:    (!) 138 kg  Height:        Intake/Output Summary (Last 24 hours) at 04/16/2022 1115 Last data filed at 04/16/2022 1106 Gross per 24 hour  Intake 960 ml  Output 5350 ml  Net -4390 ml   Filed Weights   04/13/22 1649 04/15/22 0500 04/16/22 0600  Weight: 108.9 kg (!) 141.6 kg (!) 138 kg    Examination:  Physical Exam: GEN: NAD, alert, on BiPAP, morbidly obese, appears older than stated  age, chronically ill in appearance HEENT: NCAT, PERRL, EOMI, sclera clear, MMM PULM: Diminished breath sounds bilateral bases, no wheezing/crackles, normal respiratory effort without accessory muscle use, on 3 L nasal cannula  with SPO2 95% at rest CV: RRR w/o M/G/R GI: abd soft, NTND, NABS, no R/G/M MSK: Chronic venous changes bilateral lower extremities with 2-3+ pitting edema to upper shin, moves all extremities independently  NEURO: CN II-XII intact, no focal deficits, sensation to light touch intact PSYCH: normal mood/affect Integumentary: Chronic venous stasis changes bilateral lower extremities, otherwise no other concerning rashes/lesions/wounds appreciated on exposed skin surfaces.      Data Reviewed: I have personally reviewed following labs and imaging studies  CBC: Recent Labs  Lab 04/13/22 2045  WBC 7.2  NEUTROABS 3.9  HGB 13.0  HCT 45.8  MCV 85.0  PLT 371   Basic Metabolic Panel: Recent Labs  Lab 04/13/22 1900 04/15/22 0509 04/16/22 0404  NA 138 140 138  K 4.6 3.9 3.3*  CL 96* 93* 90*  CO2 35* 42* 41*  GLUCOSE 79 99 81  BUN 6* 10 12  CREATININE 0.58 0.57 0.50  CALCIUM 8.9 8.5* 8.4*   GFR: Estimated Creatinine Clearance: 102.6 mL/min (by C-G formula based on SCr of 0.5 mg/dL). Liver Function Tests: Recent Labs  Lab 04/13/22 1900  AST 16  ALT 11  ALKPHOS 97  BILITOT 1.3*  PROT 7.2  ALBUMIN 3.1*   No results for input(s): "LIPASE", "AMYLASE" in the last 168 hours. No results for input(s): "AMMONIA" in the last 168 hours. Coagulation Profile: No results for input(s): "INR", "PROTIME" in the last 168 hours. Cardiac Enzymes: No results for input(s): "CKTOTAL", "CKMB", "CKMBINDEX", "TROPONINI" in the last 168 hours. BNP (last 3 results) No results for input(s): "PROBNP" in the last 8760 hours. HbA1C: Recent Labs    04/14/22 0902  HGBA1C 6.5*   CBG: Recent Labs  Lab 04/15/22 0818 04/15/22 1157 04/15/22 1716 04/15/22 2016 04/16/22 0734  GLUCAP 103* 100* 139* 124* 100*   Lipid Profile: Recent Labs    04/14/22 1430  CHOL 161  HDL 29*  LDLCALC 109*  TRIG 113  CHOLHDL 5.6   Thyroid Function Tests: No results for input(s): "TSH", "T4TOTAL", "FREET4",  "T3FREE", "THYROIDAB" in the last 72 hours. Anemia Panel: No results for input(s): "VITAMINB12", "FOLATE", "FERRITIN", "TIBC", "IRON", "RETICCTPCT" in the last 72 hours. Sepsis Labs: Recent Labs  Lab 04/13/22 1900 04/14/22 0305 04/14/22 0929  PROCALCITON  --   --  <0.10  LATICACIDVEN 2.2* 0.7  --     Recent Results (from the past 240 hour(s))  Culture, blood (Routine X 2) w Reflex to ID Panel     Status: None (Preliminary result)   Collection Time: 04/13/22  6:25 PM   Specimen: Right Antecubital; Blood  Result Value Ref Range Status   Specimen Description   Final    RIGHT ANTECUBITAL BLOOD Performed at [facility], [place] [address] [place], [place] [address]    Special Requests   Final    BOTTLES DRAWN AEROBIC AND ANAEROBIC Blood Culture adequate volume Performed at [place] [address], [place], [place] [address]    Culture   Final    NO GROWTH 3 DAYS Performed at [facility], [place] [address] [place], [place] [address]    Report Status PENDING  Incomplete  Culture, blood (Routine X 2) w Reflex to ID Panel     Status: None (Preliminary result)   Collection Time: 04/13/22  8:45 PM   Specimen: Left Antecubital; Blood  Result Value Ref Range Status   Specimen Description   Final    LEFT ANTECUBITAL BLOOD Performed at [facility] [address], [place], [place] [address]    Special Requests   Final    BOTTLES DRAWN AEROBIC AND ANAEROBIC Blood Culture results may not be optimal due to an excessive volume of blood received in culture bottles Performed at [facility] [address] [place], [place] [address]    Culture   Final    NO GROWTH 2 DAYS Performed at [facility] [address] [place], [place] [address]    Report Status PENDING  Incomplete  Resp Panel by RT-PCR (Flu A&B, Covid) Anterior Nasal Swab     Status: None   Collection Time: 04/13/22  8:50 PM   Specimen: Anterior Nasal Swab   Result Value Ref Range Status   SARS Coronavirus 2 by RT PCR NEGATIVE NEGATIVE Final    Comment: (NOTE) SARS-CoV-2 target nucleic acids are NOT DETECTED.  The SARS-CoV-2 RNA is generally detectable in upper respiratory specimens during the acute phase of infection. The lowest concentration of SARS-CoV-2 viral copies this assay can detect is 138 copies/mL. A negative result does not preclude SARS-Cov-2 infection and should not be used as the sole basis for treatment or other patient management decisions. A negative result may occur with  improper specimen collection/handling, submission of specimen other than nasopharyngeal swab, presence of viral mutation(s) within the areas targeted by this assay, and inadequate number of viral copies(<138 copies/mL). A negative result must be combined with clinical observations, patient history, and epidemiological information. The expected result is Negative.  Fact Sheet for Patients:  EntrepreneurPulse.com.au  Fact Sheet for Healthcare Providers:  IncredibleEmployment.be  This test is no t yet approved or cleared by the [place] FDA and  has been authorized for detection and/or diagnosis of SARS-CoV-2 by FDA under an Emergency Use Authorization (EUA). This EUA will remain  in effect (meaning this test can be used) for the duration of the COVID-19 declaration under Section 564(b)(1) of the Act, 21 U.S.C.section 360bbb-3(b)(1), unless the authorization is terminated  or revoked sooner.       Influenza A by PCR NEGATIVE NEGATIVE Final   Influenza B by PCR NEGATIVE NEGATIVE Final    Comment: (NOTE) The Xpert Xpress SARS-CoV-2/FLU/RSV plus assay is intended as an aid in the diagnosis of influenza from Nasopharyngeal swab specimens and should not be used as a sole basis for treatment. Nasal washings and aspirates are unacceptable for Xpert Xpress SARS-CoV-2/FLU/RSV testing.  Fact Sheet for  Patients: EntrepreneurPulse.com.au  Fact Sheet for Healthcare Providers: IncredibleEmployment.be  This test is not yet approved or cleared by the [place] FDA and has been authorized for detection and/or diagnosis of SARS-CoV-2 by FDA under an Emergency Use Authorization (EUA). This EUA will remain in effect (meaning this test can be used) for the duration of the COVID-19 declaration under Section 564(b)(1) of the Act, 21 U.S.C. section 360bbb-3(b)(1), unless the authorization is terminated or revoked.  Performed at [facility], [place] [address] [place], [place] [address]   Urine Culture     Status: None   Collection Time: 04/14/22 12:18 AM   Specimen: Urine, Catheterized  Result Value Ref Range Status   Specimen Description   Final    URINE, CATHETERIZED Performed at [place] [address] [place], [place] [address]  Special Requests   Final    NONE Performed at [facility], [place] [address], [place], [place] [address]    Culture   Final    NO GROWTH Performed at [facility], [place] [address] [place] [employer], [place] [address]    Report Status 04/15/2022 FINAL  Final  Gastrointestinal Panel by PCR , Stool     Status: Abnormal   Collection Time: 04/14/22  4:11 PM   Specimen: Stool  Result Value Ref Range Status   Campylobacter species DETECTED (A) NOT DETECTED Final    Comment: RESULT CALLED TO, READ BACK BY AND VERIFIED WITH: Willene Hatchet RN 1424 04/15/22 HNM    Plesimonas shigelloides NOT DETECTED NOT DETECTED Final   Salmonella species NOT DETECTED NOT DETECTED Final   Yersinia enterocolitica NOT DETECTED NOT DETECTED Final   Vibrio species NOT DETECTED NOT DETECTED Final   Vibrio cholerae NOT DETECTED NOT DETECTED Final   Enteroaggregative E coli (EAEC) NOT DETECTED NOT DETECTED Final   Enteropathogenic E coli (EPEC) NOT DETECTED NOT DETECTED Final    Enterotoxigenic E coli (ETEC) NOT DETECTED NOT DETECTED Final   Shiga like toxin producing E coli (STEC) NOT DETECTED NOT DETECTED Final   Shigella/Enteroinvasive E coli (EIEC) NOT DETECTED NOT DETECTED Final   Cryptosporidium NOT DETECTED NOT DETECTED Final   Cyclospora cayetanensis NOT DETECTED NOT DETECTED Final   Entamoeba histolytica NOT DETECTED NOT DETECTED Final   Giardia lamblia NOT DETECTED NOT DETECTED Final   Adenovirus F40/41 NOT DETECTED NOT DETECTED Final   Astrovirus NOT DETECTED NOT DETECTED Final   Norovirus GI/GII NOT DETECTED NOT DETECTED Final   Rotavirus A NOT DETECTED NOT DETECTED Final   Sapovirus (I, II, IV, and V) NOT DETECTED NOT DETECTED Final    Comment: Performed at [facility], [address] [place], [place] [address]         Radiology Studies: ECHOCARDIOGRAM COMPLETE  Result Date: 04/15/2022    ECHOCARDIOGRAM REPORT   Patient Name:   LAREINA ESPINO Date of Exam: 04/15/2022 Medical Rec #:  [id number]       Height:       64.0 in Accession #:    [id number]      Weight:       312.2 lb Date of Birth:  Dec 18, 1960       BSA:          2.364 m Patient Age:    60 years        BP:           135/78 mmHg Patient Gender: F               HR:           110 bpm. Exam Location:  Inpatient Procedure: 2D Echo, 3D Echo, Cardiac Doppler and Color Doppler Indications:    CHF-Acute Systolic F75.10  History:        Patient has no prior history of Echocardiogram examinations.                 CAD, Signs/Symptoms:Dyspnea; Risk Factors:Hypertension, Diabetes                 and Dyslipidemia. Acute hypoxic/hypercapnic respite failure.                 Acute congestive heart failure exacerbation.                 Acute metabolic encephalopathy.  Sonographer:    Jonelle Sidle  Burt Knack RDCS Referring Phys: [id number] [place]  1. Left ventricular ejection fraction, by estimation, is 55 to 60%. The left ventricle has normal function. The left ventricle has no regional wall  motion abnormalities. There is mild concentric left ventricular hypertrophy. Diastolic function indeterminant due to moderate-to-severe MAC. Elevated left atrial pressure.  2. Right ventricular systolic function is normal. The right ventricular size is mildly enlarged. There is mildly elevated pulmonary artery systolic pressure. The estimated right ventricular systolic pressure is 85.4 mmHg.  3. There is a moderate sized pericardial effusion present. The effusion is circumferential but appears largest around the RV. The IVC is dilated and does not collapse >50% with sniff but there is no RV diastolic collapse or significant respiratory variation across the MV to suggest tamponade.  4. The mitral valve is degenerative. Trivial mitral valve regurgitation. Moderate to severe mitral annular calcification.  5. The aortic valve is tricuspid. Aortic valve regurgitation is not visualized. Aortic valve sclerosis is present, with no evidence of aortic valve stenosis.  6. The inferior vena cava is dilated in size with <50% respiratory variability, suggesting right atrial pressure of 15 mmHg. Comparison(s): No prior Echocardiogram. FINDINGS  Left Ventricle: Left ventricular ejection fraction, by estimation, is 55 to 60%. The left ventricle has normal function. The left ventricle has no regional wall motion abnormalities. 3D left ventricular ejection fraction analysis performed but not reported based on interpreter judgement due to suboptimal tracking. The left ventricular internal cavity size was normal in size. There is mild concentric left ventricular hypertrophy. Diastolic function indeterminant due to moderate-to-severe MAC. Elevated left atrial pressure. Right Ventricle: The right ventricular size is mildly enlarged. No increase in right ventricular wall thickness. Right ventricular systolic function is normal. There is mildly elevated pulmonary artery systolic pressure. The tricuspid regurgitant velocity is 2.40 m/s, and  with an assumed right atrial pressure of 15 mmHg, the estimated right ventricular systolic pressure is 62.7 mmHg. Left Atrium: Left atrial size was normal in size. Right Atrium: Right atrial size was normal in size. Pericardium: There is a moderate sized pericardial effusion present. The effusion is circumferential but appears largest around the RV. The IVC is dilated and does not collapse >50% with sniff but there is no RV diastolic collapse or significant respiratory variation across the MV to suggest tamponade. Mitral Valve: The mitral valve is degenerative in appearance. There is mild thickening of the mitral valve leaflet(s). There is mild calcification of the mitral valve leaflet(s). Moderate to severe mitral annular calcification. Trivial mitral valve regurgitation. MV peak gradient, 13.1 mmHg. The mean mitral valve gradient is 6.0 mmHg. Tricuspid Valve: The tricuspid valve is normal in structure. Tricuspid valve regurgitation is trivial. Aortic Valve: The aortic valve is tricuspid. Aortic valve regurgitation is not visualized. Aortic valve sclerosis is present, with no evidence of aortic valve stenosis. Pulmonic Valve: The pulmonic valve was normal in structure. Pulmonic valve regurgitation is trivial. Aorta: The aortic root and ascending aorta are structurally normal, with no evidence of dilitation. Venous: The inferior vena cava is dilated in size with less than 50% respiratory variability, suggesting right atrial pressure of 15 mmHg. IAS/Shunts: The atrial septum is grossly normal.  LEFT VENTRICLE PLAX 2D LVIDd:         4.40 cm   Diastology LVIDs:         3.00 cm   LV e' medial:    6.85 cm/s LV PW:         1.20 cm   LV  E/e' medial:  18.4 LV IVS:        1.10 cm   LV e' lateral:   5.87 cm/s LVOT diam:     2.30 cm   LV E/e' lateral: 21.5 LV SV:         100 LV SV Index:   42 LVOT Area:     4.15 cm                           3D Volume EF:                          3D EF:        54 %                          LV  EDV:       164 ml                          LV ESV:       75 ml                          LV SV:        89 ml RIGHT VENTRICLE RV Basal diam:  5.00 cm RV Mid diam:    3.60 cm RV S prime:     17.40 cm/s TAPSE (M-mode): 2.1 cm LEFT ATRIUM             Index        RIGHT ATRIUM           Index LA diam:        3.40 cm 1.44 cm/m   RA Area:     19.40 cm LA Vol (A2C):   39.8 ml 16.84 ml/m  RA Volume:   54.00 ml  22.84 ml/m LA Vol (A4C):   53.9 ml 22.80 ml/m LA Biplane Vol: 48.4 ml 20.47 ml/m  AORTIC VALVE LVOT Vmax:   159.00 cm/s LVOT Vmean:  109.000 cm/s LVOT VTI:    0.240 m  AORTA Ao Root diam: 3.30 cm Ao Asc diam:  3.30 cm MITRAL VALVE                TRICUSPID VALVE MV Area (PHT): 3.01 cm     TR Peak grad:   23.0 mmHg MV Area VTI:   3.56 cm     TR Vmax:        240.00 cm/s MV Peak grad:  13.1 mmHg MV Mean grad:  6.0 mmHg     SHUNTS MV Vmax:       1.81 m/s     Systemic VTI:  0.24 m MV Vmean:      117.0 cm/s   Systemic Diam: 2.30 cm MV Decel Time: 252 msec MV E velocity: 126.00 cm/s MV A velocity: 179.00 cm/s MV E/A ratio:  0.70 Gwyndolyn Kaufman MD Electronically signed by Gwyndolyn Kaufman MD Signature Date/Time: 04/15/2022/3:49:24 PM    Final         Scheduled Meds:  divalproex  500 mg Oral QHS   enoxaparin (LOVENOX) injection  40 mg Subcutaneous Q24H   furosemide  40 mg Intravenous BID   insulin aspart  0-9 Units Subcutaneous TID WC   liver oil-zinc oxide   Topical BID   loratadine  10 mg Oral QHS  nicotine  21 mg Transdermal Daily   potassium chloride  40 mEq Oral Q3H   venlafaxine XR  75 mg Oral Q breakfast   Continuous Infusions:   LOS: 2 days    Time spent: 51 minutes spent on chart review, discussion with nursing staff, consultants, updating family and interview/physical exam; more than 50% of that time was spent in counseling and/or coordination of care.    Luanne Krzyzanowski J [place] DO [facility] Available via Epic secure chat 7am-7pm After these hours, please refer to coverage  provider listed on [email or web address] 04/16/2022, 11:15 AM

## 2022-04-16 NOTE — Evaluation (Signed)
Occupational Therapy Evaluation Patient Details Name: Ellen Acevedo MRN: [id number] DOB: Oct 03, 1960 Today's Date: 04/16/2022   History of Present Illness Ellen Acevedo is a 61 y.o. female with class III obesity and no documented past medical history presented to the ED via EMS from home for evaluation of diarrhea, confusion, cough, and shortness of breath.  Police Department was called for wellness check and patient found at home with bedbugs and roaches.  SPO2 77% on room air with EMS, improved with 4 L O2.  Vital signs on arrival to the ED: Temperature 98.1 F, heart rate 106, respiratory rate 23, blood pressure 159/101, SPO2 100% on 4 L O2.  Labs showing no leukocytosis, lactic acid 2.2 > 0.7, blood cultures drawn, troponin 19> 18, BNP 973, COVID and influenza PCR negative.  UA with negative nitrite, moderate leukocytes, and microscopy showing 0-5 WBCs and no bacteria.  Urine culture pending.  VBG showing pH 7.35, PCO2 80.  She was placed on BiPAP.  CTA chest negative for PE.  Showing aortic atherosclerosis, coronary artery disease, mild cardiomegaly, bilateral pleural effusions, and right upper and lower lobe airspace opacities suspicious for pneumonia versus atelectasis.  CT abdomen pelvis showing heterogeneous enhancement throughout the liver, 1.4 cm nodule within the uterus which could reflect a submucosal fibroid or endometrial polyp.  Pelvic ultrasound recommended for further evaluation.  CT also showing diffuse anasarca throughout the abdominal wall and aortic atherosclerosis.  CT head negative for acute finding. Patient was given vancomycin, cefepime, metronidazole, and 1 L normal saline bolus.     History limited as patient is currently on BiPAP.  He reports diarrhea for the past 3 weeks and mild generalized abdominal pain.  Reports shortness of breath and bilateral lower extremity edema for several months.  Denies chest pain.  She reports medical history of schizophrenia, diabetes,  hypertension, hyperlipidemia, and "heart problems."   Clinical Impression   Patient was motivated to participate in the session. She reported having mild B LE pain at rest, and she was further noted to be with deconditioning, slight generalized weakness, and intermittent unsteadiness in standing. She is presenting below her baseline level of functioning for ADL management, given the aforementioned. She required min assist for sit to stand using a RW, as well as for toileting at bathroom level. She will benefit from further OT services to maximize her safety and independence with self-care tasks.       Recommendations for follow up therapy are one component of a multi-disciplinary discharge planning process, led by the attending physician.  Recommendations may be updated based on patient status, additional functional criteria and insurance authorization.   Follow Up Recommendations  Skilled nursing-short term rehab (<3 hours/day)    Assistance Recommended at Discharge Intermittent Supervision/Assistance  Patient can return home with the following A little help with walking and/or transfers;A lot of help with bathing/dressing/bathroom;Assist for transportation;Assistance with cooking/housework    Functional Status Assessment  Patient has had a recent decline in their functional status and demonstrates the ability to make significant improvements in function in a reasonable and predictable amount of time.  Equipment Recommendations  Tub/shower seat    Recommendations for Other Services       Precautions / Restrictions Precautions Precautions: Fall Precaution Comments: skin integrity Restrictions Weight Bearing Restrictions: No      Mobility Bed Mobility Overal bed mobility: Needs Assistance Bed Mobility: Supine to Sit     Supine to sit: Supervision, HOB elevated  Transfers Overall transfer level: Needs assistance Equipment used: Rolling walker (2 wheels) Transfers:  Sit to/from Stand Sit to Stand: From elevated surface, Min assist             Balance     Sitting balance-Leahy Scale: Good         Standing balance comment: min guard to occasional min assist             ADL either performed or assessed with clinical judgement   ADL   Eating/Feeding: Independent Eating/Feeding Details (indicate cue type and reason): based on clinical judgement Grooming: Min guard Grooming Details (indicate cue type and reason): She performed teeth brushing and face washing with set-up assist seated EOB, then hand washing in standing at the sink withmin guard assist. She required 1 verbal cue to step closer to the sink when performing hand washing.         Upper Body Dressing : Set up Upper Body Dressing Details (indicate cue type and reason): simulated seated EOB     Toilet Transfer: Minimal assistance;Ambulation;Rolling walker (2 wheels) Toilet Transfer Details (indicate cue type and reason): Min verbal cues provided for safe transfer technique, including reaching for grab bar when transferring onto & off the toilet. Toileting- Clothing Manipulation and Hygiene: Minimal assistance;Sit to/from stand                Pertinent Vitals/Pain Pain Assessment Pain Assessment: 0-10 Pain Score: 3  Pain Location: B LE Pain Intervention(s): Repositioned     Hand Dominance     Extremity/Trunk Assessment Upper Extremity Assessment Upper Extremity Assessment: Overall WFL for tasks assessed   Lower Extremity Assessment Lower Extremity Assessment: Generalized weakness       Communication Communication Communication: No difficulties   Cognition Arousal/Alertness: Awake/alert Behavior During Therapy: WFL for tasks assessed/performed                       Home Living Family/patient expects to be discharged to:: Private residence Living Arrangements: Alone   Type of Home: Apartment Home Access: Level entry     Home Layout: One  level     Bathroom Shower/Tub: Sponge bathes at baseline         Home Equipment: None          Prior Functioning/Environment Prior Level of Function : Independent/Modified Independent             Mobility Comments: reports she is independent without AD at baseline ADLs Comments: states that she does own bathing/dressing, uses frozen meals mostly. Gets groceries delivered but has been having hard time with lifting/getting groceries into house.        OT Problem List: Decreased strength;Impaired balance (sitting and/or standing);Decreased knowledge of use of DME or AE      OT Treatment/Interventions: Self-care/ADL training;Therapeutic exercise;Energy conservation;DME and/or AE instruction;Balance training;Patient/family education;Therapeutic activities    OT Goals(Current goals can be found in the care plan section) Acute Rehab OT Goals Patient Stated Goal: To return home soon OT Goal Formulation: With patient Time For Goal Achievement: 04/30/22 Potential to Achieve Goals: Good ADL Goals Pt Will Perform Grooming: with modified independence;standing Pt Will Perform Lower Body Dressing: with modified independence;sit to/from stand;with adaptive equipment Pt Will Transfer to Toilet: with modified independence;ambulating Pt Will Perform Toileting - Clothing Manipulation and hygiene: with modified independence;sit to/from stand  OT Frequency: Min 2X/week       AM-PAC OT "6 Clicks" Daily Activity     Outcome Measure  Help from another person eating meals?: None Help from another person taking care of personal grooming?: A Little Help from another person toileting, which includes using toliet, bedpan, or urinal?: A Little Help from another person bathing (including washing, rinsing, drying)?: A Lot Help from another person to put on and taking off regular upper body clothing?: None Help from another person to put on and taking off regular lower body clothing?: A Lot 6 Click  Score: 18   End of Session Equipment Utilized During Treatment: Gait belt;Rolling walker (2 wheels) Nurse Communication: Mobility status  Activity Tolerance: Patient tolerated treatment well Patient left: in chair;with call bell/phone within reach  OT Visit Diagnosis: Unsteadiness on feet (R26.81);Muscle weakness (generalized) (M62.81)                Time: 3606-7703 OT Time Calculation (min): 22 min Charges:  OT General Charges $OT Visit: 1 Visit OT Evaluation $OT Eval Moderate Complexity: 1 Mod   Patra Gherardi L [place] Kintz, OTR/L 04/16/2022, 2:43 PM

## 2022-04-16 NOTE — TOC Progression Note (Addendum)
Transition of Care [facility]) - Progression Note    Patient Details  Name: Ellen Acevedo MRN: [id number] Date of Birth: [birth year]  Transition of Care [facility]) CM/SW Contact  Roseanne Kaufman, RN Phone Number: 04/16/2022, 12:19 PM  Clinical Narrative:   Met with patient at bedside who reports her sister just walked away. PT has recommended short term SNF placement. This RNCM left voicemail message for call back for patient's sister Ellen Acevedo via phone.   TOC will continue to follow  -12:30p Zack with [employer] will follow patient on the outpatient setting for triology/Bipap. Once able to speak with patient's sister Ellen Acevedo, will confirm patient's PCP.   TOC will continue to follow.  -2:35p received call back from patient's sister, left another voicemail message for call back.   TOC will continue to follow.  - 4:37p Spoke with patient's sister Ellen Acevedo who is agreement with short term SNF placement. Will fax patient out for bed offers. Patient and sister reports patient has PCP Dr. Nolene Ebbs with [facility]. This RNCM confirmed with his office that patient is still a patient with him. Notified Zack with [employer] of PCP to follow in outpatient setting for home bipap.   Patients's sister Ellen Acevedo reports she filed for legal guardianship today and has a court date scheduled 04/26/22 at [place] will continue to follow.   Expected Discharge Plan: [place] Barriers to Discharge: Continued Medical Work up  Expected Discharge Plan and Services Expected Discharge Plan: [place] In-house Referral: NA Discharge Planning Services: CM Consult Post Acute Care Choice: NA Living arrangements for the past 2 months: Apartment                                       Social Determinants of Health (SDOH) Interventions    Readmission Risk Interventions     No data to display

## 2022-04-17 DIAGNOSIS — J9601 Acute respiratory failure with hypoxia: Secondary | ICD-10-CM | POA: Diagnosis not present

## 2022-04-17 LAB — GLUCOSE, CAPILLARY
Glucose-Capillary: 76 mg/dL (ref 70–99)
Glucose-Capillary: 95 mg/dL (ref 70–99)
Glucose-Capillary: 97 mg/dL (ref 70–99)
Glucose-Capillary: 120 mg/dL — ABNORMAL HIGH (ref 70–99)

## 2022-04-17 LAB — CBC
HCT: 42.9 % (ref 36.0–46.0)
Hemoglobin: 11.7 g/dL — ABNORMAL LOW (ref 12.0–15.0)
MCH: 23.4 pg — ABNORMAL LOW (ref 26.0–34.0)
MCHC: 27.3 g/dL — ABNORMAL LOW (ref 30.0–36.0)
MCV: 85.8 fL (ref 80.0–100.0)
Platelets: 160 10*3/uL (ref 150–400)
RBC: 5 MIL/uL (ref 3.87–5.11)
RDW: 20.2 % — ABNORMAL HIGH (ref 11.5–15.5)
WBC: 6.2 10*3/uL (ref 4.0–10.5)
nRBC: 0 % (ref 0.0–0.2)

## 2022-04-17 LAB — BASIC METABOLIC PANEL
Anion gap: 5 (ref 5–15)
BUN: 11 mg/dL (ref 8–23)
CO2: 44 mmol/L — ABNORMAL HIGH (ref 22–32)
Calcium: 8.5 mg/dL — ABNORMAL LOW (ref 8.9–10.3)
Chloride: 90 mmol/L — ABNORMAL LOW (ref 98–111)
Creatinine, Ser: 0.46 mg/dL (ref 0.44–1.00)
GFR, Estimated: >60 mL/min (ref >60)
Glucose, Bld: 72 mg/dL (ref 70–99)
Potassium: 3.6 mmol/L (ref 3.5–5.1)
Sodium: 139 mmol/L (ref 135–145)

## 2022-04-17 LAB — MAGNESIUM: Magnesium: 1.9 mg/dL (ref 1.7–2.4)

## 2022-04-17 NOTE — Progress Notes (Signed)
PROGRESS NOTE    Ellen Acevedo  IOE:[id number] DOB: 03-Nov-1960 DOA: 04/13/2022 PCP: Nolene Ebbs, MD  [id number]  LOS: 3 days   Brief hospital course:   Assessment & Plan: Ellen Acevedo is a 61 y.o. female with past medical history significant for schizophrenia, type 2 diabetes mellitus, essential hypertension, and reported "heart problems" but no documented past medical history who presented to [facility] ED on 10/14 via EMS from home for evaluation of diarrhea, confusion, cough and shortness of breath.  The Police Department was called for a wellness check and patient was found at home with bedbugs and roaches.  Patient reports diarrhea over the last 3 weeks with mild generalized abdominal pain.     Acute metabolic encephalopathy: Improving Patient presenting via EMS with confusion, likely secondary to hypercapnic and hypoxic respiratory failure.  Patient was afebrile without leukocytosis.  CT head with no acute findings.  No focal neurological deficit. --Supportive care, treatment as below   Acute hypoxic and hypercapnic respiratory failure, POA Acute congestive heart failure exacerbation, undifferentiated Patient was noted to be hypoxic with SPO2 84% on room air and placed on supplemental oxygen.  On arrival to the ED VBG notable for elevated CO2 level of 80.  BNP elevated at 973.3.  CT angiogram chest negative for PE, questionable lung opacity likely related to atelectasis as patient afebrile without leukocytosis.  Procalciton within normal limits.  Etiology likely multifactorial, in the setting of morbid obesity and likely undiagnosed CHF. --TTE with LVEF 55-60%, mild concentric LVH, indeterminant diastolic function due to severe MAC, moderate-sized pericardial effusion circumferential without tamponade, trivial MR, IVC dilated. -- started on Furosemide 40 mg IV q12h Plan: --cont IV lasix 40 BID -- Continue BiPAP as needed while napping and nightly --Continue supplemental O2 to keep  sats >=90%, wean as tolerated -- ADAPT consulted for consideration of BiPAP versus trilogy vent on discharge   Campylobacter gastroenteritis Patient is afebrile without leukocytosis, CT abdomen/pelvis without evidence of colitis.  Procalcitonin negative.  GI PCR panel positive for Campylobacter.  Diarrhea since resolved and has formed stools.  Discontinued enteric precautions.  Supportive care.   CAD Noted on CT chest.  Troponin slightly elevated at 19 followed by 18; stable.  Denies chest pain.  Not consistent with ACS.  EKG with no concerning dynamic changes.     Fatty liver disease CT notable for heterogeneous enhancement throughout the liver.  LFTs within normal limits.  Right upper quadrant ultrasound with mild increased echogenicity throughout the liver without focal lesion suggestive of hepatic steatosis, pericholecystic fluid without evidence for cholelithiasis, gallbladder not distended.   Uterine abnormality CT abdomen/pelvis with 1.4 cm enhancing nodule centrally within the uterus could reflect a submucosal fibroid or endometrial polyp.  Pelvic ultrasound Limited examination due to morbid obesity but with endometrial thickness considered abnormal for asymptomatic postmenopausal result patient with recommendation of endometrial sampling to exclude carcinoma.  Outpatient follow-up with GYN.   Type 2 diabetes mellitus Hemoglobin A1c 6.5.  Patient does not recall her medications she takes --BG have been within inpatient goal --d/c BG checks and SSI   Essential hypertension Patient does not recall her home medications. --BP acceptable   Hyperlipidemia LDL 109.      History of reported schizophrenia Patient does not recall her medications.   Weakness/debility/deconditioning: PT/OT recommend SNF placement.  Continue therapy efforts while inpatient.  TOC for placement.   Social: Very poor living conditions, patient unable to take care of herself at baseline.  Family seeking  guardianship.   DVT prophylaxis: Lovenox SQ Code Status: Full code  Family Communication:  Level of care: Progressive Dispo:   The patient is from: home Anticipated d/c is to: SNF rehab Anticipated d/c date is: whenever bed available   Subjective and Interval History:  Pt was found to be very sleepy and hard to wake at times, however, would wake up to ask for each meals and eat.   Objective: Vitals:   04/16/22 2229 04/17/22 0413 04/17/22 0500 04/17/22 1234  BP:  128/68  (!) 122/52  Pulse: 94 (!) 105  (!) 102  Resp: _0 Temp:  98.1 F (36.7 C)  (!) 97.5 F (36.4 C)  TempSrc:  Oral  Oral  SpO2: 97% 97%  97%  Weight:   133.4 kg   Height:        Intake/Output Summary (Last 24 hours) at 04/17/2022 1713 Last data filed at 04/17/2022 1658 Gross per 24 hour  Intake 1200 ml  Output 5200 ml  Net -4000 ml   Filed Weights   04/15/22 0500 04/16/22 0600 04/17/22 0500  Weight: (!) 141.6 kg (!) 138 kg 133.4 kg    Examination:   Constitutional: NAD, sleepy CV: No cyanosis.   RESP: normal respiratory effort, on 3L SKIN: warm, dry   Data Reviewed: I have personally reviewed labs and imaging studies  Time spent: 35 minutes  Enzo Bi, MD [facility] If 7PM-7AM, please contact night-coverage 04/17/2022, 5:13 PM

## 2022-04-17 NOTE — NC FL2 (Signed)
[place] MEDICAID FL2 LEVEL OF CARE SCREENING TOOL     IDENTIFICATION  Patient Name: Ellen Acevedo Birthdate: 1960/12/30 Sex: female Admission Date (Current Location): 04/13/2022  [facility] and [place] Number:  Herbalist and Address:  [facility],  [address] [place], [place]      Provider Number: [id number]  Attending Physician Name and Address:  Enzo Bi, MD  Relative Name and Phone Number:  Ajanae Virag (sister) [phone]    Current Level of Care: Hospital Recommended Level of Care: [place] Prior Approval Number:    Date Approved/Denied:   PASRR Number: Pending  Discharge Plan: SNF    Current Diagnoses: Patient Active Problem List   Diagnosis Date Noted   Acute hypoxemic respiratory failure ([place]) 04/14/2022   Acute CHF (congestive heart failure) ([place]) 04/14/2022   Diarrhea 04/14/2022   CAD (coronary artery disease) 04/14/2022   Schizophrenia ([place]) 04/14/2022   Diabetes ([place]) 04/14/2022   HTN (hypertension) 04/14/2022   HLD (hyperlipidemia) 04/14/2022   Acute on chronic respiratory failure with hypoxia and hypercapnia ([place]) 04/14/2022    Orientation RESPIRATION BLADDER Height & Weight     Self, Time, Situation  O2 (3L nasal cannula) Continent Weight: 133.4 kg Height:  5\' 4"  (162.6 cm)  BEHAVIORAL SYMPTOMS/MOOD NEUROLOGICAL BOWEL NUTRITION STATUS      Continent Diet (heart healthy, thin fluid with 1800 ml fluid restriction)  AMBULATORY STATUS COMMUNICATION OF NEEDS Skin   Limited Assist Verbally Normal                       Personal Care Assistance Level of Assistance  Bathing, Feeding, Dressing Bathing Assistance: Limited assistance Feeding assistance: Independent Dressing Assistance: Limited assistance     Functional Limitations Info  Sight, Hearing, Speech Sight Info: Adequate Hearing Info: Adequate Speech Info: Adequate    SPECIAL CARE FACTORS FREQUENCY  PT (By licensed PT), OT (By  licensed OT)     PT Frequency: 5x per week OT Frequency: 5x per week            Contractures Contractures Info: Not present    Additional Factors Info  Code Status, Allergies Code Status Info: full Allergies Info: Trilafon (Perphenazine)           Current Medications (04/17/2022):  This is the current hospital active medication list Current Facility-Administered Medications  Medication Dose Route Frequency Provider Last Rate Last Admin   acetaminophen (TYLENOL) tablet 650 mg  650 mg Oral Q6H PRN Shela Leff, MD       Or   acetaminophen (TYLENOL) suppository 650 mg  650 mg Rectal Q6H PRN Shela Leff, MD       albuterol (PROVENTIL) (2.5 MG/3ML) 0.083% nebulizer solution 2.5 mg  2.5 mg Nebulization Q6H PRN [place] Eric J, DO       divalproex (DEPAKOTE) DR tablet 500 mg  500 mg Oral QHS [place] Donnamarie Poag, DO   500 mg at 04/16/22 2112   enoxaparin (LOVENOX) injection 40 mg  40 mg Subcutaneous Q24H Shela Leff, MD   40 mg at 04/16/22 2112   furosemide (LASIX) injection 40 mg  40 mg Intravenous BID [place] Eric J, DO   40 mg at 04/16/22 1734   insulin aspart (novoLOG) injection 0-9 Units  0-9 Units Subcutaneous TID WC [place] Eric J, DO   1 Units at 04/15/22 1802   liver oil-zinc oxide (DESITIN) 40 % ointment   Topical BID [place] Eric J, DO   Given  at 04/16/22 1008   loratadine (CLARITIN) tablet 10 mg  10 mg Oral QHS [place], Alvira Philips, DO   10 mg at 04/16/22 2112   nicotine (NICODERM CQ - dosed in mg/24 hours) patch 21 mg  21 mg Transdermal Daily [place], Eric J, DO   21 mg at 04/16/22 1008   Oral care mouth rinse  15 mL Mouth Rinse PRN [place], Alvira Philips, DO       venlafaxine XR (EFFEXOR-XR) 24 hr capsule 75 mg  75 mg Oral Q breakfast [place], Eric J, DO   75 mg at 04/16/22 1008     Discharge Medications: Please see discharge summary for a list of discharge medications.  Relevant Imaging Results:  Relevant Lab Results:   Additional Information SSN:  [id number], See medication list for psychotropic medications  Lavenia Atlas, RN

## 2022-04-17 NOTE — Progress Notes (Signed)
Occupational Therapy Treatment Patient Details Name: Ellen Acevedo MRN: [id number] DOB: 11-12-1960 Today's Date: 04/17/2022   History of present illness Ellen Acevedo is a 61 y.o. female with class III obesity and no documented past medical history presented to the ED via EMS from home for evaluation of diarrhea, confusion, cough, and shortness of breath.  Police Department was called for wellness check and patient found at home with bedbugs and roaches.  SPO2 77% on room air with EMS, improved with 4 L O2.  Vital signs on arrival to the ED: Temperature 98.1 F, heart rate 106, respiratory rate 23, blood pressure 159/101, SPO2 100% on 4 L O2.  Labs showing no leukocytosis, lactic acid 2.2 > 0.7, blood cultures drawn, troponin 19> 18, BNP 973, COVID and influenza PCR negative.  UA with negative nitrite, moderate leukocytes, and microscopy showing 0-5 WBCs and no bacteria.  Urine culture pending.  VBG showing pH 7.35, PCO2 80.  She was placed on BiPAP.  CTA chest negative for PE.  Showing aortic atherosclerosis, coronary artery disease, mild cardiomegaly, bilateral pleural effusions, and right upper and lower lobe airspace opacities suspicious for pneumonia versus atelectasis.  CT abdomen pelvis showing heterogeneous enhancement throughout the liver, 1.4 cm nodule within the uterus which could reflect a submucosal fibroid or endometrial polyp.  Pelvic ultrasound recommended for further evaluation.  CT also showing diffuse anasarca throughout the abdominal wall and aortic atherosclerosis.  CT head negative for acute finding. Patient was given vancomycin, cefepime, metronidazole, and 1 L normal saline bolus.     History limited as patient is currently on BiPAP.  He reports diarrhea for the past 3 weeks and mild generalized abdominal pain.  Reports shortness of breath and bilateral lower extremity edema for several months.  Denies chest pain.  She reports medical history of schizophrenia, diabetes,  hypertension, hyperlipidemia, and "heart problems."   OT comments  Patient presented with increased lethargy today, and required intermittent cues for arousal. Due to her lethargy, attempts at out of bed functional activity were deferred. She performed grooming & upper body dressing in the semi-fowler's position with set-up/supervision. She was also able to roll left & right & scoot to the head of the bed with supervision. She was instructed on simple bed level ROM and therapeutic exercises for strengthening and improved flexibility, as she reported general feelings of stiffness and discomfort in her B LE. Continue OT plan of care.    Recommendations for follow up therapy are one component of a multi-disciplinary discharge planning process, led by the attending physician.  Recommendations may be updated based on patient status, additional functional criteria and insurance authorization.    Follow Up Recommendations  Skilled nursing-short term rehab (<3 hours/day)    Assistance Recommended at Discharge Intermittent Supervision/Assistance  Patient can return home with the following  A little help with walking and/or transfers;A lot of help with bathing/dressing/bathroom;Assist for transportation;Assistance with cooking/housework   Equipment Recommendations  Tub/shower seat       Precautions / Restrictions Precautions Precautions: Fall Precaution Comments: skin integrity Restrictions Weight Bearing Restrictions: No       Mobility Bed Mobility Overal bed mobility: Needs Assistance Bed Mobility: Rolling Rolling: Supervision         General bed mobility comments: She rolled left & right, as well as scooted to the head of the bed with supervision.    Transfers          General transfer comment: out of bed transfers were deferred, as  the pt was limited by increased lethargy         ADL either performed or assessed with clinical judgement   ADL   Eating/Feeding:  Independent   Grooming: Set up;Bed level Grooming Details (indicate cue type and reason): She performed face washing, applying lip moisturizer, and teeth brushing in the semi-fowler's position.         Upper Body Dressing : Set up;Bed level Upper Body Dressing Details (indicate cue type and reason): She doffed a hospital gown then donned another clean one in the semi-fowler's position.                        Cognition Arousal/Alertness: Lethargic, Able to follow 1-2 step commands                          Pertinent Vitals/ Pain       Pain Assessment Pain Assessment: 0-10 Pain Score: 3  Pain Location: B LE Pain Intervention(s): Repositioned     Prior Functioning/Environment              Frequency  Min 2X/week        Progress Toward Goals  OT Goals(current goals can now be found in the care plan section)  Progress towards OT goals: Progressing toward goals  Acute Rehab OT Goals Patient Stated Goal: to return home soon OT Goal Formulation: With patient Time For Goal Achievement: 04/30/22 Potential to Achieve Goals: Good  Plan Discharge plan remains appropriate       AM-PAC OT "6 Clicks" Daily Activity     Outcome Measure   Help from another person eating meals?: None Help from another person taking care of personal grooming?: None Help from another person toileting, which includes using toliet, bedpan, or urinal?: A Little Help from another person bathing (including washing, rinsing, drying)?: A Lot Help from another person to put on and taking off regular upper body clothing?: None Help from another person to put on and taking off regular lower body clothing?: A Lot 6 Click Score: 19    End of Session Equipment Utilized During Treatment: Oxygen  OT Visit Diagnosis: Unsteadiness on feet (R26.81);Muscle weakness (generalized) (M62.81)   Activity Tolerance Patient limited by lethargy   Patient Left in bed;with call bell/phone within  reach;with bed alarm set   Nurse Communication Mobility status        Time: 8527-7824 OT Time Calculation (min): 16 min  Charges: OT General Charges $OT Visit: 1 Visit OT Treatments $Self Care/Home Management : 8-22 mins    Leota Sauers, OTR/L 04/17/2022, 5:29 PM

## 2022-04-17 NOTE — Progress Notes (Signed)
PT Cancellation Note  Patient Details Name: Ellen Acevedo MRN: [id number] DOB: 12-Mar-1961   Cancelled Treatment:    Reason Eval/Treat Not Completed: Fatigue/lethargy limiting ability to participate, patient  did  not arouse with stimulation to participate in PT. RN aware. [place] Office [phone] Weekend [phone]    Claretha Cooper 04/17/2022, 3:59 PM

## 2022-04-17 NOTE — TOC Progression Note (Signed)
Transition of Care [facility]) - Progression Note    Patient Details  Name: Ellen Acevedo MRN: [id number] Date of Birth: [birth year]  Transition of Care [facility]) CM/SW [place], RN Phone Number: 04/17/2022, 2:39 PM  Clinical Narrative:   Presented short term SNF bed offers to patient and family. Family chose [facility] SNF.  Notified Kitty with [facility] of bed availability, will have bed available  tomorrow.  Kitty with [facility] called this patient back to advise they will not be able to take patient's insurance for rehab, declined offer.   Spoke with Lexine Baton at [facility], who offered bed, awaiting pending PASRR number.  TOC will continue to follow.       Expected Discharge Plan: [place] Barriers to Discharge: Continued Medical Work up  Expected Discharge Plan and Services Expected Discharge Plan: [place] In-house Referral: NA Discharge Planning Services: CM Consult Post Acute Care Choice: NA Living arrangements for the past 2 months: Apartment                                       Social Determinants of Health (SDOH) Interventions    Readmission Risk Interventions     No data to display

## 2022-04-17 NOTE — TOC Progression Note (Signed)
Transition of Care [facility]) - Progression Note    Patient Details  Name: Ellen Acevedo MRN: [id number] Date of Birth: [birth year]  Transition of Care [facility]) CM/SW Contact  Roseanne Kaufman, RN Phone Number: 04/17/2022, 11:02 AM  Clinical Narrative:   Patient is under review for PASRR, uploaded additional clinical. Awaiting PASRR review.  Faxed patient out for short term SNF, awaiting bed offers.     TOC will continue to follow.    Expected Discharge Plan: [place] Barriers to Discharge: Continued Medical Work up  Expected Discharge Plan and Services Expected Discharge Plan: [place] In-house Referral: NA Discharge Planning Services: CM Consult Post Acute Care Choice: NA Living arrangements for the past 2 months: Apartment                                       Social Determinants of Health (SDOH) Interventions    Readmission Risk Interventions     No data to display

## 2022-04-18 DIAGNOSIS — J9601 Acute respiratory failure with hypoxia: Secondary | ICD-10-CM | POA: Diagnosis not present

## 2022-04-18 LAB — CBC
HCT: 46.1 % — ABNORMAL HIGH (ref 36.0–46.0)
Hemoglobin: 12.5 g/dL (ref 12.0–15.0)
MCH: 23.4 pg — ABNORMAL LOW (ref 26.0–34.0)
MCHC: 27.1 g/dL — ABNORMAL LOW (ref 30.0–36.0)
MCV: 86.2 fL (ref 80.0–100.0)
Platelets: 147 10*3/uL — ABNORMAL LOW (ref 150–400)
RBC: 5.35 MIL/uL — ABNORMAL HIGH (ref 3.87–5.11)
RDW: 20 % — ABNORMAL HIGH (ref 11.5–15.5)
WBC: 4.5 10*3/uL (ref 4.0–10.5)
nRBC: 0 % (ref 0.0–0.2)

## 2022-04-18 LAB — BLOOD GAS, VENOUS
Acid-Base Excess: 27.1 mmol/L — ABNORMAL HIGH (ref 0.0–2.0)
Bicarbonate: 57.7 mmol/L — ABNORMAL HIGH (ref 20.0–28.0)
O2 Saturation: 81 %
Patient temperature: 37
pCO2, Ven: 89 mmHg (ref 44–60)
pH, Ven: 7.42 (ref 7.25–7.43)
pO2, Ven: 52 mmHg — ABNORMAL HIGH (ref 32–45)

## 2022-04-18 LAB — BASIC METABOLIC PANEL
BUN: 12 mg/dL (ref 8–23)
CO2: >45 mmol/L — ABNORMAL HIGH (ref 22–32)
Calcium: 8.8 mg/dL — ABNORMAL LOW (ref 8.9–10.3)
Chloride: 88 mmol/L — ABNORMAL LOW (ref 98–111)
Creatinine, Ser: 0.48 mg/dL (ref 0.44–1.00)
GFR, Estimated: >60 mL/min (ref >60)
Glucose, Bld: 75 mg/dL (ref 70–99)
Potassium: 3.4 mmol/L — ABNORMAL LOW (ref 3.5–5.1)
Sodium: 140 mmol/L (ref 135–145)

## 2022-04-18 LAB — CULTURE, BLOOD (ROUTINE X 2)
Culture: NO GROWTH
Special Requests: ADEQUATE

## 2022-04-18 LAB — MAGNESIUM: Magnesium: 1.8 mg/dL (ref 1.7–2.4)

## 2022-04-18 MED ORDER — POTASSIUM CHLORIDE CRYS ER 20 MEQ PO TBCR
40.0000 meq | EXTENDED_RELEASE_TABLET | Freq: Once | ORAL | Status: AC
  Administered 2022-04-18: 40 meq via ORAL
  Filled 2022-04-18: qty 2

## 2022-04-18 NOTE — Progress Notes (Addendum)
PROGRESS NOTE  Ellen Acevedo  EQA:[id number] DOB: 1961/04/02 DOA: 04/13/2022 PCP: Fleet Contras, MD   Brief Narrative: Patient is a 61 year old female with history of schizophrenia, type 2 diabetes, hypertension, morbid obesity who presented here with complaint of diarrhea, confusion, cough, shortness of breath from home.  Her home was also found to be infested with bedbugs, roaches.  She reported diarrhea for 3 weeks, mild generalized abdominal pain.  Also found to be confused on presentation.  PT/OT recommended SNF on discharge.  Placement is pending.  Assessment & Plan:  Principal Problem:   Acute hypoxemic respiratory failure (HCC) Active Problems:   Acute CHF (congestive heart failure) (HCC)   Diarrhea   CAD (coronary artery disease)   Schizophrenia (HCC)   Diabetes (HCC)   HTN (hypertension)   HLD (hyperlipidemia)   Acute on chronic respiratory failure with hypoxia and hypercapnia (HCC)  Acute hypoxic respiratory failure: Presented with hypoxia, saturating 84% on room air, placed on supplemental oxygen.  Hypoxia secondary to multifactorial etiology.  Likely contributed by obesity hypoventilation syndrome, OSA, chronic diastolic CHF.  Currently needing BiPAP at night.  CT angiogram of the chest was negative for PE, questionable lung opacity secondary to atelectasis.  Continue BiPAP as needed.  ABG showed hypercarbia but she is compensated, pH is normal.ADAPT consulted for consideration of BiPAP versus trilogy vent on discharge . She uses CPAP machine at home.  Not on oxygen at day time.  Acute diastolic congestive heart failure: BNP elevated on presentation.  Echo showed EF of 55 to 60%, mild concentric left ventricular hypertrophy, indeterminate diastolic function, moderate-sized pericardial effusion without tamponade.  Still on IV Lasix.  Acute metabolic encephalopathy: Improving,now alert and oriented.  Presented with confusion.  Could be also contributed from hypercarbia.  CT  head without any acute findings.  Campylobacter gastroenteritis: Presented with diarrhea.  CT abdomen status post without any evidence of colitis.  GI PCR positive for Campylobacter.  Diarrhea has resolved, she is making formed stools now.  Enteric precaution discontinued.  Coronary artery disease: Noted on CT chest.  Complained of chest pain this morning now has resolved.  Troponins normal.  EKG without any findings suggestive of ischemic changes  Fatty liver disease: Right upper quadrant ultrasound showed mildly increased echogenicity throughout the liver.  Uterine abnormality:CT abdomen/pelvis with 1.4 cm enhancing nodule centrally within the uterus could reflect a submucosal fibroid or endometrial polyp.  Pelvic ultrasound Limited examination due to morbid obesity but with endometrial thickness considered abnormal for asymptomatic postmenopausal result patient with recommendation of endometrial sampling to exclude carcinoma.  Outpatient follow-up with GYN.   Diabetes type 2: A1c of 6.5.  Currently on sliding scale insulin.  Monitor blood sugars  Hypertension: Currently blood pressure stable without any medications  History of schizophrenia: Home medications information not obtained yet  Debility/deconditioning/poor living situation: Was living at home.  Was found to have infestation with bedbugs, roaches.  Patient unable to take care of herself at baseline.  Family seeking guardianship.  Very poor living condition.  PT/OT recommending SNF on discharge.  TOC following      DVT prophylaxis:enoxaparin (LOVENOX) injection 40 mg Start: 04/14/22 2200     Code Status: Full Code  Family Communication: None at bedside  Patient status:Inpatient  Patient is from :Home  Anticipated discharge to:SNF  Estimated DC date:Not sure,toc following   Consultants: None  Procedures:None  Antimicrobials:  Anti-infectives (From admission, onward)    Start     Dose/Rate Route Frequency Ordered  Stop  04/13/22 1745  ceFEPIme (MAXIPIME) 2 g in sodium chloride 0.9 % 100 mL IVPB        2 g 200 mL/hr over 30 Minutes Intravenous  Once 04/13/22 1733 04/13/22 2219   04/13/22 1745  metroNIDAZOLE (FLAGYL) IVPB 500 mg        500 mg 100 mL/hr over 60 Minutes Intravenous  Once 04/13/22 1733 04/14/22 0652   04/13/22 1745  vancomycin (VANCOCIN) IVPB 1000 mg/200 mL premix        1,000 mg 200 mL/hr over 60 Minutes Intravenous  Once 04/13/22 1733 04/14/22 0653       Subjective: Patient seen and examined at bedside today.  She was sitting in the chair.  Appears very comfortable.  Alert and oriented.  Denies any shortness of breath, cough.  Answered most of the questions Objective: Vitals:   04/18/22 0500 04/18/22 0509 04/18/22 0512 04/18/22 0751  BP:  131/79  (!) 149/75  Pulse:  (!) 114 97 93  Resp:  20  18  Temp:  97.6 F (36.4 C)  97.9 F (36.6 C)  TempSrc:  Oral  Oral  SpO2:  100% 95% 95%  Weight: 128.3 kg     Height:        Intake/Output Summary (Last 24 hours) at 04/18/2022 1012 Last data filed at 04/18/2022 0518 Gross per 24 hour  Intake 960 ml  Output 5050 ml  Net -4090 ml   Filed Weights   04/16/22 0600 04/17/22 0500 04/18/22 0500  Weight: (!) 138 kg 133.4 kg 128.3 kg    Examination:  General exam: Overall comfortable, not in distress,morbidly obese HEENT: PERRL Respiratory system:  no wheezes or crackles, diminished air sounds Cardiovascular system: S1 & S2 heard, RRR.  Gastrointestinal system: Abdomen is nondistended, soft and nontender. Central nervous system: Alert and oriented Extremities: Lower extremity edema, no clubbing ,no cyanosis Skin: No rashes, no ulcers,no icterus     Data Reviewed: I have personally reviewed following labs and imaging studies  CBC: Recent Labs  Lab 04/13/22 2045 04/17/22 0417 04/18/22 0410  WBC 7.2 6.2 4.5  NEUTROABS 3.9  --   --   HGB 13.0 11.7* 12.5  HCT 45.8 42.9 46.1*  MCV 85.0 85.8 86.2  PLT 185 160 147*    Basic Metabolic Panel: Recent Labs  Lab 04/13/22 1900 04/15/22 0509 04/16/22 0404 04/17/22 0417 04/18/22 0410  NA 138 140 138 139 140  K 4.6 3.9 3.3* 3.6 3.4*  CL 96* 93* 90* 90* 88*  CO2 35* 42* 41* 44* >45*  GLUCOSE 79 99 81 72 75  BUN 6* 10 12 11 12   CREATININE 0.58 0.57 0.50 0.46 0.48  CALCIUM 8.9 8.5* 8.4* 8.5* 8.8*  MG  --   --   --  1.9 1.8     Recent Results (from the past 240 hour(s))  Culture, blood (Routine X 2) w Reflex to ID Panel     Status: None   Collection Time: 04/13/22  6:25 PM   Specimen: Right Antecubital; Blood  Result Value Ref Range Status   Specimen Description   Final    RIGHT ANTECUBITAL BLOOD Performed at [facility], [address] [place], [place] [address]    Special Requests   Final    BOTTLES DRAWN AEROBIC AND ANAEROBIC Blood Culture adequate volume Performed at [facility] [address] [place], [place] [address]    Culture   Final    NO GROWTH 5 DAYS Performed at [facility], [address], [place], [place] [address]    Report Status 04/18/2022 FINAL  Final  Culture, blood (Routine X 2) w Reflex to ID Panel     Status: None (Preliminary result)   Collection Time: 04/13/22  8:45 PM   Specimen: Left Antecubital; Blood  Result Value Ref Range Status   Specimen Description   Final    LEFT ANTECUBITAL BLOOD Performed at [facility], [address], [place], [place] [address]    Special Requests   Final    BOTTLES DRAWN AEROBIC AND ANAEROBIC Blood Culture results may not be optimal due to an excessive volume of blood received in culture bottles Performed at [facility], [address] [place], [place] [address]    Culture   Final    NO GROWTH 4 DAYS Performed at [facility], [address] [place], [place] [address]    Report Status PENDING  Incomplete  Resp Panel by RT-PCR (Flu A&B, Covid) Anterior Nasal Swab     Status: None   Collection Time: 04/13/22   8:50 PM   Specimen: Anterior Nasal Swab  Result Value Ref Range Status   SARS Coronavirus 2 by RT PCR NEGATIVE NEGATIVE Final    Comment: (NOTE) SARS-CoV-2 target nucleic acids are NOT DETECTED.  The SARS-CoV-2 RNA is generally detectable in upper respiratory specimens during the acute phase of infection. The lowest concentration of SARS-CoV-2 viral copies this assay can detect is 138 copies/mL. A negative result does not preclude SARS-Cov-2 infection and should not be used as the sole basis for treatment or other patient management decisions. A negative result may occur with  improper specimen collection/handling, submission of specimen other than nasopharyngeal swab, presence of viral mutation(s) within the areas targeted by this assay, and inadequate number of viral copies(<138 copies/mL). A negative result must be combined with clinical observations, patient history, and epidemiological information. The expected result is Negative.  Fact Sheet for Patients:  BloggerCourse.com  Fact Sheet for Healthcare Providers:  SeriousBroker.it  This test is no t yet approved or cleared by the [place] FDA and  has been authorized for detection and/or diagnosis of SARS-CoV-2 by FDA under an Emergency Use Authorization (EUA). This EUA will remain  in effect (meaning this test can be used) for the duration of the COVID-19 declaration under Section 564(b)(1) of the Act, 21 U.S.C.section 360bbb-3(b)(1), unless the authorization is terminated  or revoked sooner.       Influenza A by PCR NEGATIVE NEGATIVE Final   Influenza B by PCR NEGATIVE NEGATIVE Final    Comment: (NOTE) The Xpert Xpress SARS-CoV-2/FLU/RSV plus assay is intended as an aid in the diagnosis of influenza from Nasopharyngeal swab specimens and should not be used as a sole basis for treatment. Nasal washings and aspirates are unacceptable for Xpert Xpress  SARS-CoV-2/FLU/RSV testing.  Fact Sheet for Patients: BloggerCourse.com  Fact Sheet for Healthcare Providers: SeriousBroker.it  This test is not yet approved or cleared by the [place] FDA and has been authorized for detection and/or diagnosis of SARS-CoV-2 by FDA under an Emergency Use Authorization (EUA). This EUA will remain in effect (meaning this test can be used) for the duration of the COVID-19 declaration under Section 564(b)(1) of the Act, 21 U.S.C. section 360bbb-3(b)(1), unless the authorization is terminated or revoked.  Performed at [facility], [address] [place], [place] [address]   Urine Culture     Status: None  Collection Time: 04/14/22 12:18 AM   Specimen: Urine, Catheterized  Result Value Ref Range Status   Specimen Description   Final    URINE, CATHETERIZED Performed at [facility], [address], [place], [place] [address]    Special Requests   Final    NONE Performed at [facility], [address] [place], [place] [address]    Culture   Final    NO GROWTH Performed at [facility], [address], [place], [place] [address]    Report Status 04/15/2022 FINAL  Final  Gastrointestinal Panel by PCR , Stool     Status: Abnormal   Collection Time: 04/14/22  4:11 PM   Specimen: Stool  Result Value Ref Range Status   Campylobacter species DETECTED (A) NOT DETECTED Final    Comment: RESULT CALLED TO, READ BACK BY AND VERIFIED WITH: Phillis Knack RN 1424 04/15/22 HNM    Plesimonas shigelloides NOT DETECTED NOT DETECTED Final   Salmonella species NOT DETECTED NOT DETECTED Final   Yersinia enterocolitica NOT DETECTED NOT DETECTED Final   Vibrio species NOT DETECTED NOT DETECTED Final   Vibrio cholerae NOT DETECTED NOT DETECTED Final   Enteroaggregative E coli (EAEC) NOT DETECTED NOT DETECTED Final   Enteropathogenic E coli (EPEC)  NOT DETECTED NOT DETECTED Final   Enterotoxigenic E coli (ETEC) NOT DETECTED NOT DETECTED Final   Shiga like toxin producing E coli (STEC) NOT DETECTED NOT DETECTED Final   Shigella/Enteroinvasive E coli (EIEC) NOT DETECTED NOT DETECTED Final   Cryptosporidium NOT DETECTED NOT DETECTED Final   Cyclospora cayetanensis NOT DETECTED NOT DETECTED Final   Entamoeba histolytica NOT DETECTED NOT DETECTED Final   Giardia lamblia NOT DETECTED NOT DETECTED Final   Adenovirus F40/41 NOT DETECTED NOT DETECTED Final   Astrovirus NOT DETECTED NOT DETECTED Final   Norovirus GI/GII NOT DETECTED NOT DETECTED Final   Rotavirus A NOT DETECTED NOT DETECTED Final   Sapovirus (I, II, IV, and V) NOT DETECTED NOT DETECTED Final    Comment: Performed at [facility], [address] [place], [place] [address]     Radiology Studies: No results found.  Scheduled Meds:  divalproex  500 mg Oral QHS   enoxaparin (LOVENOX) injection  40 mg Subcutaneous Q24H   furosemide  40 mg Intravenous BID   liver oil-zinc oxide   Topical BID   loratadine  10 mg Oral QHS   nicotine  21 mg Transdermal Daily   venlafaxine XR  75 mg Oral Q breakfast   Continuous Infusions:   LOS: 4 days   Burnadette Pop, MD [facility] P10/19/2023, 10:12 AM

## 2022-04-18 NOTE — Progress Notes (Signed)
PT was removed from BiPAP at approximately 0800. PT was awake and stating she was ready to come off and order breakfast, PT was oriented to time, place and self. PT was placed on 4 lpm nasal cannula with Sp02 94%- RT decreased 02 to 3 LPM (no usage at home per PT) to keep Sp02 >92% Per MD order. Discussed with RN that 88% seems more reasonable due to PT is a retainer (last VBG pH was compensated). RT / RN will attempt to obtain an order for Sp02 =88%. PT does not appear to be in respiratory distress at this time. RN aware.

## 2022-04-19 DIAGNOSIS — J9601 Acute respiratory failure with hypoxia: Secondary | ICD-10-CM | POA: Diagnosis not present

## 2022-04-19 LAB — BASIC METABOLIC PANEL
Anion gap: 7 (ref 5–15)
BUN: 14 mg/dL (ref 8–23)
CO2: 44 mmol/L — ABNORMAL HIGH (ref 22–32)
Calcium: 8.6 mg/dL — ABNORMAL LOW (ref 8.9–10.3)
Chloride: 89 mmol/L — ABNORMAL LOW (ref 98–111)
Creatinine, Ser: 0.49 mg/dL (ref 0.44–1.00)
GFR, Estimated: >60 mL/min (ref >60)
Glucose, Bld: 83 mg/dL (ref 70–99)
Potassium: 3.1 mmol/L — ABNORMAL LOW (ref 3.5–5.1)
Sodium: 140 mmol/L (ref 135–145)

## 2022-04-19 LAB — CULTURE, BLOOD (ROUTINE X 2): Culture: NO GROWTH

## 2022-04-19 MED ORDER — POTASSIUM CHLORIDE CRYS ER 20 MEQ PO TBCR
40.0000 meq | EXTENDED_RELEASE_TABLET | Freq: Two times a day (BID) | ORAL | Status: DC
  Administered 2022-04-19 – 2022-04-22 (×8): 40 meq via ORAL
  Filled 2022-04-19 (×8): qty 2

## 2022-04-19 NOTE — Progress Notes (Signed)
SATURATION QUALIFICATIONS: (This note is used to comply with regulatory documentation for home oxygen)  Patient Saturations on Room Air at Rest = 93%  Patient Saturations on Room Air while Ambulating = 78%    Please briefly explain why patient needs home oxygen: to maintain appropriate SaO2 levels with activity.   Blondell Reveal Kistler PT 04/19/2022  Acute Rehabilitation Services  Office [phone]

## 2022-04-19 NOTE — Progress Notes (Signed)
Pt's cardiac monitor is D/C. Talked with MD, tele is rule out ACS, pt's pulse maintain steady. MD stated "I am okay with keeping her off tele."

## 2022-04-19 NOTE — Progress Notes (Signed)
Physical Therapy Treatment Patient Details Name: Ellen Acevedo MRN: [id number] DOB: 1961-05-20 Today's Date: 04/19/2022   History of Present Illness Ellen Acevedo is a 61 y.o. female with class III obesity and no documented past medical history presented to the ED via EMS from home for evaluation of diarrhea, confusion, cough, and shortness of breath.  Police Department was called for wellness check and patient found at home with bedbugs and roaches.  SPO2 77% on room air with EMS, improved with 4 L O2.  Vital signs on arrival to the ED: Temperature 98.1 F, heart rate 106, respiratory rate 23, blood pressure 159/101, SPO2 100% on 4 L O2.  Labs showing no leukocytosis, lactic acid 2.2 > 0.7, blood cultures drawn, troponin 19> 18, BNP 973, COVID and influenza PCR negative.  UA with negative nitrite, moderate leukocytes, and microscopy showing 0-5 WBCs and no bacteria.  Urine culture pending.  VBG showing pH 7.35, PCO2 80.  She was placed on BiPAP.  CTA chest negative for PE.  Showing aortic atherosclerosis, coronary artery disease, mild cardiomegaly, bilateral pleural effusions, and right upper and lower lobe airspace opacities suspicious for pneumonia versus atelectasis.  CT abdomen pelvis showing heterogeneous enhancement throughout the liver, 1.4 cm nodule within the uterus which could reflect a submucosal fibroid or endometrial polyp.  Pelvic ultrasound recommended for further evaluation.  CT also showing diffuse anasarca throughout the abdominal wall and aortic atherosclerosis.  CT head negative for acute finding. Patient was given vancomycin, cefepime, metronidazole, and 1 L normal saline bolus.     History limited as patient is currently on BiPAP.  He reports diarrhea for the past 3 weeks and mild generalized abdominal pain.  Reports shortness of breath and bilateral lower extremity edema for several months.  Denies chest pain.  She reports medical history of schizophrenia, diabetes, hypertension,  hyperlipidemia, and "heart problems."    PT Comments    Pt is progressing well with mobility, she ambulated 80' with RW, no loss of balance. SaO2 93% at rest on room air, 78% on room air with ambulation, pt denied dyspnea.    Recommendations for follow up therapy are one component of a multi-disciplinary discharge planning process, led by the attending physician.  Recommendations may be updated based on patient status, additional functional criteria and insurance authorization.  Follow Up Recommendations  Skilled nursing-short term rehab (<3 hours/day) Can patient physically be transported by private vehicle: Yes   Assistance Recommended at Discharge Intermittent Supervision/Assistance  Patient can return home with the following A little help with walking and/or transfers;A little help with bathing/dressing/bathroom;Assistance with cooking/housework;Direct supervision/assist for medications management;Assist for transportation;Help with stairs or ramp for entrance   Equipment Recommendations  None recommended by PT    Recommendations for Other Services       Precautions / Restrictions Precautions Precautions: Fall Precaution Comments: monitor O2 Restrictions Weight Bearing Restrictions: No     Mobility  Bed Mobility Overal bed mobility: Needs Assistance Bed Mobility: Supine to Sit     Supine to sit: HOB elevated, Supervision     General bed mobility comments: used rail    Transfers Overall transfer level: Needs assistance Equipment used: Rolling walker (2 wheels) Transfers: Sit to/from Stand Sit to Stand: Supervision, From elevated surface           General transfer comment: VCs hand placement    Ambulation/Gait Ambulation/Gait assistance: Min guard Gait Distance (Feet): 60 Feet Assistive device: Rolling walker (2 wheels) Gait Pattern/deviations: Step-through pattern, Decreased stride  length Gait velocity: decr     General Gait Details: steady, SaO2 78%  on room air walking, 1/4 dyspnea, HR 112; SaO2  93% room air rest   Stairs             Wheelchair Mobility    Modified Cryer (Stroke Patients Only)       Balance Overall balance assessment: Needs assistance Sitting-balance support: Feet supported Sitting balance-Leahy Scale: Fair     Standing balance support: Bilateral upper extremity supported, During functional activity Standing balance-Leahy Scale: Poor                              Cognition Arousal/Alertness: Awake/alert Behavior During Therapy: WFL for tasks assessed/performed Overall Cognitive Status: Within Functional Limits for tasks assessed                                          Exercises      General Comments        Pertinent Vitals/Pain Pain Assessment Pain Assessment: No/denies pain    Home Living                          Prior Function            PT Goals (current goals can now be found in the care plan section) Acute Rehab PT Goals Patient Stated Goal: get apartment cleaned up and be able to move around home better PT Goal Formulation: With patient Time For Goal Achievement: 04/29/22 Potential to Achieve Goals: Good Progress towards PT goals: Progressing toward goals    Frequency    Min 3X/week      PT Plan Current plan remains appropriate    Co-evaluation              AM-PAC PT "6 Clicks" Mobility   Outcome Measure  Help needed turning from your back to your side while in a flat bed without using bedrails?: A Little Help needed moving from lying on your back to sitting on the side of a flat bed without using bedrails?: A Little Help needed moving to and from a bed to a chair (including a wheelchair)?: A Little Help needed standing up from a chair using your arms (e.g., wheelchair or bedside chair)?: A Little Help needed to walk in hospital room?: A Little Help needed climbing 3-5 steps with a railing? : A Lot 6 Click  Score: 17    End of Session Equipment Utilized During Treatment: Gait belt;Oxygen Activity Tolerance: Patient tolerated treatment well Patient left: with call bell/phone within reach;in chair Nurse Communication: Mobility status PT Visit Diagnosis: Unsteadiness on feet (R26.81);Muscle weakness (generalized) (M62.81);Difficulty in walking, not elsewhere classified (R26.2)     Time: 1962-2297 PT Time Calculation (min) (ACUTE ONLY): 15 min  Charges:  $Gait Training: 8-22 mins                     Blondell Reveal Kistler PT 04/19/2022  Acute Rehabilitation Services  Office [phone]

## 2022-04-19 NOTE — Progress Notes (Addendum)
PROGRESS NOTE  Ellen Acevedo  WPY:[id number] DOB: September 08, 1960 DOA: 04/13/2022 PCP: Nolene Ebbs, MD   Brief Narrative: Patient is a 61 year old female with history of schizophrenia, type 2 diabetes, hypertension, morbid obesity who presented here with complaint of diarrhea, confusion, cough, shortness of breath from home.  Her home was also found to be infested with bedbugs, roaches.  She reported diarrhea for 3 weeks, mild generalized abdominal pain.  Also found to be confused on presentation.  PT/OT recommended SNF on discharge.  Placement is pending.  Patient still appears volume overloaded, on IV Lasix  Assessment & Plan:  Principal Problem:   Acute hypoxemic respiratory failure (HCC) Active Problems:   Acute CHF (congestive heart failure) (HCC)   Diarrhea   CAD (coronary artery disease)   Schizophrenia (HCC)   Diabetes (HCC)   HTN (hypertension)   HLD (hyperlipidemia)   Acute on chronic respiratory failure with hypoxia and hypercapnia (HCC)  Acute hypoxic respiratory failure: Presented with hypoxia, saturating 84% on room air, placed on supplemental oxygen.  Hypoxia secondary to multifactorial etiology.  Likely contributed by obesity hypoventilation syndrome, OSA, chronic diastolic CHF.  Currently needing BiPAP at night but she is noncompliant .  CT angiogram of the chest was negative for PE, questionable lung opacity secondary to atelectasis.  Continue BiPAP as needed.  ABG showed hypercarbia but she is compensated, pH is normal.[facility] consulted for consideration of BiPAP versus trilogy vent on discharge .[facility] will follow patient on the outpatient setting for triology/Bipap Not on oxygen at day time at baseline.  Acute diastolic congestive heart failure: BNP elevated on presentation.  Echo showed EF of 55 to 60%, mild concentric left ventricular hypertrophy, indeterminate diastolic function, moderate-sized pericardial effusion without tamponade.  We will continue  on IV Lasix.  Has  persistent lower extremity edema, having good urine output with preservation of kidney function  Acute metabolic encephalopathy: Improving,now alert and oriented.  Presented with confusion.  Could be also contributed from hypercarbia.  CT head without any acute findings.  Campylobacter gastroenteritis: Presented with diarrhea.  CT abdomen status post without any evidence of colitis.  GI PCR positive for Campylobacter.  Diarrhea has resolved, she is making formed stools now.  Enteric precaution discontinued.  Coronary artery disease: Noted on CT chest.  Complained of chest pain this morning now has resolved.  Troponins normal.  EKG without any findings suggestive of ischemic changes  Fatty liver disease: Right upper quadrant ultrasound showed mildly increased echogenicity throughout the liver.  Uterine abnormality:CT abdomen/pelvis with 1.4 cm enhancing nodule centrally within the uterus could reflect a submucosal fibroid or endometrial polyp.  Pelvic ultrasound Limited examination due to morbid obesity but with endometrial thickness considered abnormal for asymptomatic postmenopausal result patient with recommendation of endometrial sampling to exclude carcinoma.  Outpatient follow-up with GYN.   Diabetes type 2: A1c of 6.5.  Currently on sliding scale insulin.  Monitor blood sugars  Hypertension: Currently blood pressure stable without any medications  History of schizophrenia: Home medications information not obtained yet  Debility/deconditioning/poor living situation: Was living at home.  Was found to have infestation with bedbugs, roaches.  Patient unable to take care of herself at baseline.  Family seeking guardianship.  Very poor living condition.  PT/OT recommending SNF on discharge.  TOC following      DVT prophylaxis:enoxaparin (LOVENOX) injection 40 mg Start: 04/14/22 2200     Code Status: Full Code  Family Communication: Called and discussed with  sister [name] on  10/20 Patient status:Inpatient  Patient is from :Home  Anticipated discharge to:SNF  Estimated DC date:Not sure,toc following   Consultants: None  Procedures:None  Antimicrobials:  Anti-infectives (From admission, onward)    Start     Dose/Rate Route Frequency Ordered Stop   04/13/22 1745  ceFEPIme (MAXIPIME) 2 g in sodium chloride 0.9 % 100 mL IVPB        2 g 200 mL/hr over 30 Minutes Intravenous  Once 04/13/22 1733 04/13/22 2219   04/13/22 1745  metroNIDAZOLE (FLAGYL) IVPB 500 mg        500 mg 100 mL/hr over 60 Minutes Intravenous  Once 04/13/22 1733 04/14/22 0652   04/13/22 1745  vancomycin (VANCOCIN) IVPB 1000 mg/200 mL premix        1,000 mg 200 mL/hr over 60 Minutes Intravenous  Once 04/13/22 1733 04/14/22 0653       Subjective: Patient seen and examined at bedside this morning.  Lying in bed.  On supplemental oxygen.  Did not use much of BiPAP at night.  Saturating at 88 to 89% on 2 L of oxygen.  Not in respiratory distress.  Continues to have lower extremity edema.  Objective: Vitals:   04/18/22 1700 04/18/22 1701 04/18/22 2010 04/19/22 0523  BP:   138/86 (!) 141/73  Pulse:   100 (!) 102  Resp:    18  Temp:   97.8 F (36.6 C) (!) 97.5 F (36.4 C)  TempSrc:   Oral Oral  SpO2: (!) 83% 90% 97% 90%  Weight:    124.5 kg  Height:        Intake/Output Summary (Last 24 hours) at 04/19/2022 1152 Last data filed at 04/19/2022 1129 Gross per 24 hour  Intake 120 ml  Output 3200 ml  Net -3080 ml   Filed Weights   04/17/22 0500 04/18/22 0500 04/19/22 0523  Weight: 133.4 kg 128.3 kg 124.5 kg    Examination:   General exam: Overall comfortable, not in distress, morbidly obese, weak HEENT: PERRL Respiratory system:  no wheezes or crackles, diminished air sounds on both sides Cardiovascular system: S1 & S2 heard, RRR.  Gastrointestinal system: Abdomen is nondistended, soft and nontender. Central nervous system: Alert and oriented Extremities: 1-2+ lower  extremity edema, no clubbing ,no cyanosis Skin: No rashes, no ulcers,no icterus     Data Reviewed: I have personally reviewed following labs and imaging studies  CBC: Recent Labs  Lab 04/13/22 2045 04/17/22 0417 04/18/22 0410  WBC 7.2 6.2 4.5  NEUTROABS 3.9  --   --   HGB 13.0 11.7* 12.5  HCT 45.8 42.9 46.1*  MCV 85.0 85.8 86.2  PLT 185 160 [id number]*   Basic Metabolic Panel: Recent Labs  Lab 04/15/22 0509 04/16/22 0404 04/17/22 0417 04/18/22 0410 04/19/22 0405  NA 140 138 139 140 140  K 3.9 3.3* 3.6 3.4* 3.1*  CL 93* 90* 90* 88* 89*  CO2 42* 41* 44* >45* 44*  GLUCOSE 99 81 72 75 83  BUN 10 12 11 12 14   CREATININE 0.57 0.50 0.46 0.48 0.49  CALCIUM 8.5* 8.4* 8.5* 8.8* 8.6*  MG  --   --  1.9 1.8  --      Recent Results (from the past 240 hour(s))  Culture, blood (Routine X 2) w Reflex to ID Panel     Status: None   Collection Time: 04/13/22  6:25 PM   Specimen: Right Antecubital; Blood  Result Value Ref Range Status   Specimen Description   Final    RIGHT ANTECUBITAL  BLOOD Performed at [facility], [place] [address] [place], [place] [address]    Special Requests   Final    BOTTLES DRAWN AEROBIC AND ANAEROBIC Blood Culture adequate volume Performed at [facility] [address], [place], [place] [address]    Culture   Final    NO GROWTH 5 DAYS Performed at [facility], [place] [address], [place], [place] [address]    Report Status 04/18/2022 FINAL  Final  Culture, blood (Routine X 2) w Reflex to ID Panel     Status: None   Collection Time: 04/13/22  8:45 PM   Specimen: Left Antecubital; Blood  Result Value Ref Range Status   Specimen Description   Final    LEFT ANTECUBITAL BLOOD Performed at [facility], [place] [address] [place], [place] [address]    Special Requests   Final    BOTTLES DRAWN AEROBIC AND ANAEROBIC Blood Culture results may not be optimal due to an excessive volume of blood received in culture  bottles Performed at [facility] [address] [place], [place] [address]    Culture   Final    NO GROWTH 5 DAYS Performed at [facility], [place] [address], [place],  [address]    Report Status 04/19/2022 FINAL  Final  Resp Panel by RT-PCR (Flu A&B, Covid) Anterior Nasal Swab     Status: None   Collection Time: 04/13/22  8:50 PM   Specimen: Anterior Nasal Swab  Result Value Ref Range Status   SARS Coronavirus 2 by RT PCR NEGATIVE NEGATIVE Final    Comment: (NOTE) SARS-CoV-2 target nucleic acids are NOT DETECTED.  The SARS-CoV-2 RNA is generally detectable in upper respiratory specimens during the acute phase of infection. The lowest concentration of SARS-CoV-2 viral copies this assay can detect is 138 copies/mL. A negative result does not preclude SARS-Cov-2 infection and should not be used as the sole basis for treatment or other patient management decisions. A negative result may occur with  improper specimen collection/handling, submission of specimen other than nasopharyngeal swab, presence of viral mutation(s) within the areas targeted by this assay, and inadequate number of viral copies(<138 copies/mL). A negative result must be combined with clinical observations, patient history, and epidemiological information. The expected result is Negative.  Fact Sheet for Patients:  EntrepreneurPulse.com.au  Fact Sheet for Healthcare Providers:  IncredibleEmployment.be  This test is no t yet approved or cleared by the [place] FDA and  has been authorized for detection and/or diagnosis of SARS-CoV-2 by FDA under an Emergency Use Authorization (EUA). This EUA will remain  in effect (meaning this test can be used) for the duration of the COVID-19 declaration under Section 564(b)(1) of the Act, 21 U.S.C.section 360bbb-3(b)(1), unless the authorization is terminated  or revoked sooner.       Influenza A by  PCR NEGATIVE NEGATIVE Final   Influenza B by PCR NEGATIVE NEGATIVE Final    Comment: (NOTE) The Xpert Xpress SARS-CoV-2/FLU/RSV plus assay is intended as an aid in the diagnosis of influenza from Nasopharyngeal swab specimens and should not be used as a sole basis for treatment. Nasal washings and aspirates are unacceptable for Xpert Xpress SARS-CoV-2/FLU/RSV testing.  Fact Sheet for Patients: EntrepreneurPulse.com.au  Fact Sheet for Healthcare Providers: IncredibleEmployment.be  This test is not yet approved or cleared by the [place] FDA and has been authorized for detection and/or diagnosis of SARS-CoV-2 by FDA under an Emergency Use Authorization (  EUA). This EUA will remain in effect (meaning this test can be used) for the duration of the COVID-19 declaration under Section 564(b)(1) of the Act, 21 U.S.C. section 360bbb-3(b)(1), unless the authorization is terminated or revoked.  Performed at [facility], [place] [address], [place], [place] [address]   Urine Culture     Status: None   Collection Time: 04/14/22 12:18 AM   Specimen: Urine, Catheterized  Result Value Ref Range Status   Specimen Description   Final    URINE, CATHETERIZED Performed at [facility] [address] [place], [place] [address]    Special Requests   Final    NONE Performed at [facility], [place] [address], [place], [place] [address]    Culture   Final    NO GROWTH Performed at [facility], [place] [address] [place], [place] [address]    Report Status 04/15/2022 FINAL  Final  Gastrointestinal Panel by PCR , Stool     Status: Abnormal   Collection Time: 04/14/22  4:11 PM   Specimen: Stool  Result Value Ref Range Status   Campylobacter species DETECTED (A) NOT DETECTED Final    Comment: RESULT CALLED TO, READ BACK BY AND VERIFIED WITH: Willene Hatchet RN 1424 04/15/22 HNM    Plesimonas  shigelloides NOT DETECTED NOT DETECTED Final   Salmonella species NOT DETECTED NOT DETECTED Final   Yersinia enterocolitica NOT DETECTED NOT DETECTED Final   Vibrio species NOT DETECTED NOT DETECTED Final   Vibrio cholerae NOT DETECTED NOT DETECTED Final   Enteroaggregative E coli (EAEC) NOT DETECTED NOT DETECTED Final   Enteropathogenic E coli (EPEC) NOT DETECTED NOT DETECTED Final   Enterotoxigenic E coli (ETEC) NOT DETECTED NOT DETECTED Final   Shiga like toxin producing E coli (STEC) NOT DETECTED NOT DETECTED Final   Shigella/Enteroinvasive E coli (EIEC) NOT DETECTED NOT DETECTED Final   Cryptosporidium NOT DETECTED NOT DETECTED Final   Cyclospora cayetanensis NOT DETECTED NOT DETECTED Final   Entamoeba histolytica NOT DETECTED NOT DETECTED Final   Giardia lamblia NOT DETECTED NOT DETECTED Final   Adenovirus F40/41 NOT DETECTED NOT DETECTED Final   Astrovirus NOT DETECTED NOT DETECTED Final   Norovirus GI/GII NOT DETECTED NOT DETECTED Final   Rotavirus A NOT DETECTED NOT DETECTED Final   Sapovirus (I, II, IV, and V) NOT DETECTED NOT DETECTED Final    Comment: Performed at [facility], [address], [place],  [address]     Radiology Studies: No results found.  Scheduled Meds:  divalproex  500 mg Oral QHS   enoxaparin (LOVENOX) injection  40 mg Subcutaneous Q24H   furosemide  40 mg Intravenous BID   liver oil-zinc oxide   Topical BID   loratadine  10 mg Oral QHS   nicotine  21 mg Transdermal Daily   potassium chloride  40 mEq Oral BID   venlafaxine XR  75 mg Oral Q breakfast   Continuous Infusions:   LOS: 5 days   Shelly Coss, MD [facility] P10/20/2023, 11:52 AM

## 2022-04-19 NOTE — Plan of Care (Signed)

## 2022-04-19 NOTE — Progress Notes (Signed)
PT not tolerating nocturnal bipap, states she does not use this at home, and does not want to use it the rest of the night.

## 2022-04-19 NOTE — TOC Progression Note (Addendum)
Transition of Care [facility]) - Progression Note    Patient Details  Name: IVET GUERRIERI MRN: [id number] Date of Birth: July 08, 1960  Transition of Care [facility]) CM/SW Contact  Roseanne Kaufman, RN Phone Number: 04/19/2022, 2:44 PM  Clinical Narrative:   Spoke with representative with [facility] who advised patient is level II screening, and in person interview is needed, PASRR pending. All requested clinical has been submitted.  Zack with [facility] is following for outpatient bipap or triology, this RNCM provided PCP information. [facility] unable to provide pulmonary function test at bedside, resulting in [facility] following in outpatient setting.   TOC will continue to follow.   - 3:30p Spoke with patient's sister [name] to advise level II PASRR pending.  TOC will continue to follow.   Expected Discharge Plan: [place] Barriers to Discharge: Continued Medical Work up  Expected Discharge Plan and Services Expected Discharge Plan: [place] In-house Referral: NA Discharge Planning Services: CM Consult Post Acute Care Choice: NA Living arrangements for the past 2 months: Apartment                   DME Agency: NA       HH Arranged: NA HH Agency: NA         Social Determinants of Health (SDOH) Interventions    Readmission Risk Interventions     No data to display

## 2022-04-20 DIAGNOSIS — J9601 Acute respiratory failure with hypoxia: Secondary | ICD-10-CM | POA: Diagnosis not present

## 2022-04-20 LAB — BASIC METABOLIC PANEL
BUN: 14 mg/dL (ref 8–23)
CO2: >45 mmol/L — ABNORMAL HIGH (ref 22–32)
Calcium: 8.9 mg/dL (ref 8.9–10.3)
Chloride: 88 mmol/L — ABNORMAL LOW (ref 98–111)
Creatinine, Ser: 0.37 mg/dL — ABNORMAL LOW (ref 0.44–1.00)
GFR, Estimated: >60 mL/min (ref >60)
Glucose, Bld: 83 mg/dL (ref 70–99)
Potassium: 3.3 mmol/L — ABNORMAL LOW (ref 3.5–5.1)
Sodium: 141 mmol/L (ref 135–145)

## 2022-04-20 LAB — TSH: TSH: 3.457 u[IU]/mL (ref 0.350–4.500)

## 2022-04-20 MED ORDER — TRAMADOL HCL 50 MG PO TABS
50.0000 mg | ORAL_TABLET | Freq: Once | ORAL | Status: AC
  Administered 2022-04-20: 50 mg via ORAL
  Filled 2022-04-20: qty 1

## 2022-04-20 MED ORDER — METOPROLOL TARTRATE 25 MG PO TABS
12.5000 mg | ORAL_TABLET | Freq: Two times a day (BID) | ORAL | Status: DC
  Administered 2022-04-20 – 2022-04-21 (×3): 12.5 mg via ORAL
  Filled 2022-04-20 (×3): qty 1

## 2022-04-20 NOTE — Progress Notes (Addendum)
PROGRESS NOTE  Ellen Acevedo  WUJ:[id number] DOB: [birth year] DOA: 04/13/2022 PCP: Fleet Contras, MD   Brief Narrative: Patient is a 62 year old female with history of schizophrenia, type 2 diabetes, hypertension, morbid obesity who presented here with complaint of diarrhea, confusion, cough, shortness of breath from home.  Her home was also found to be infested with bedbugs, roaches.  She reported diarrhea for 3 weeks, mild generalized abdominal pain.  Also found to be confused on presentation. Patient still appears volume overloaded,so continued  on IV Lasix.She qualified for home oxygen. PT/OT recommended SNF on discharge.  Placement is pending.    Assessment & Plan:  Principal Problem:   Acute hypoxemic respiratory failure (HCC) Active Problems:   Acute CHF (congestive heart failure) (HCC)   Diarrhea   CAD (coronary artery disease)   Schizophrenia (HCC)   Diabetes (HCC)   HTN (hypertension)   HLD (hyperlipidemia)   Acute on chronic respiratory failure with hypoxia and hypercapnia (HCC)  Acute hypoxic respiratory failure: Presented with hypoxia, saturating 84% on room air, placed on supplemental oxygen.  Hypoxia secondary to multifactorial etiology.  Likely contributed by obesity hypoventilation syndrome, OSA, chronic diastolic CHF.  Currently recommending BiPAP at night but she is noncompliant .  CT angiogram of the chest was negative for PE, questionable lung opacity secondary to atelectasis.    ABG showed hypercarbia but she is compensated, pH is normal.[facility] consulted for consideration of BiPAP versus trilogy vent on discharge .[facility] will follow patient on the outpatient setting for triology/Bipap. Not on oxygen at day time at baseline.  Acute diastolic congestive heart failure: BNP elevated on presentation.  Echo showed EF of 55 to 60%, mild concentric left ventricular hypertrophy, indeterminate diastolic function, moderate-sized pericardial effusion without tamponade.  We will  continue  on IV Lasix.  Has persistent lower extremity edema, having good urine output with preservation of kidney function. Also started on low-dose metoprolol.  Acute metabolic encephalopathy: Improving,now alert and oriented.  Presented with confusion.  Could be also contributed from hypercarbia.  CT head without any acute findings.  Campylobacter gastroenteritis: Presented with diarrhea.  CT abdomen status post without any evidence of colitis.  GI PCR positive for Campylobacter.  Diarrhea has resolved, she is making formed stools now.  Enteric precaution discontinued.  Coronary artery disease: Noted on CT chest.  Complained of chest pain this morning now has resolved.  Troponins normal.  EKG without any findings suggestive of ischemic changes  Fatty liver disease: Right upper quadrant ultrasound showed mildly increased echogenicity throughout the liver.  Uterine abnormality:CT abdomen/pelvis with 1.4 cm enhancing nodule centrally within the uterus could reflect a submucosal fibroid or endometrial polyp.  Pelvic ultrasound Limited examination due to morbid obesity but with endometrial thickness considered abnormal for asymptomatic postmenopausal result patient with recommendation of endometrial sampling to exclude carcinoma.  Outpatient follow-up with GYN.   Diabetes type 2: A1c of 6.5.  Currently on sliding scale insulin.  Monitor blood sugars  Hypertension: Currently blood pressure stable   History of schizophrenia: Home medications information not obtained yet  Debility/deconditioning/poor living situation: Was living at home.  Was found to have infestation with bedbugs, roaches.  Patient unable to take care of herself at baseline.  Family seeking guardianship.  Very poor living condition.  PT/OT recommending SNF on discharge.  TOC following      DVT prophylaxis:enoxaparin (LOVENOX) injection 40 mg Start: 04/14/22 2200     Code Status: Full Code  Family Communication: Called and  discussed with  sister [name] on 10/20 Patient status:Inpatient  Patient is from :Home  Anticipated discharge to:SNF  Estimated DC date:Not sure,toc following   Consultants: None  Procedures:None  Antimicrobials:  Anti-infectives (From admission, onward)    Start     Dose/Rate Route Frequency Ordered Stop   04/13/22 1745  ceFEPIme (MAXIPIME) 2 g in sodium chloride 0.9 % 100 mL IVPB        2 g 200 mL/hr over 30 Minutes Intravenous  Once 04/13/22 1733 04/13/22 2219   04/13/22 1745  metroNIDAZOLE (FLAGYL) IVPB 500 mg        500 mg 100 mL/hr over 60 Minutes Intravenous  Once 04/13/22 1733 04/14/22 0652   04/13/22 1745  vancomycin (VANCOCIN) IVPB 1000 mg/200 mL premix        1,000 mg 200 mL/hr over 60 Minutes Intravenous  Once 04/13/22 1733 04/14/22 0653       Subjective: Patient seen and examined at bedside this morning.  Hemodynamically stable.  Lying in bed.  Has been having good diuresis on IV Lasix with preserved renal function.  Still has bilateral lower extremity edema.  Objective: Vitals:   04/19/22 0523 04/19/22 1449 04/19/22 1933 04/20/22 0610  BP: (!) 141/73 129/82 131/77 138/80  Pulse: (!) 102 (!) 120 (!) 106 (!) 105  Resp: 18 20 16 19   Temp: (!) 97.5 F (36.4 C) 98.2 F (36.8 C) 98.3 F (36.8 C) 98.6 F (37 C)  TempSrc: Oral Oral Oral Oral  SpO2: 90% 100% 94% 94%  Weight: 124.5 kg   127.1 kg  Height:        Intake/Output Summary (Last 24 hours) at 04/20/2022 1118 Last data filed at 04/20/2022 1008 Gross per 24 hour  Intake 600 ml  Output 5701 ml  Net -5101 ml   Filed Weights   04/18/22 0500 04/19/22 0523 04/20/22 0610  Weight: 128.3 kg 124.5 kg 127.1 kg    Examination:   General exam: Overall comfortable, not in distress, morbidly obese, weak,deconditioned HEENT: PERRL Respiratory system:  no wheezes or crackles diminished air sounds bilaterally Cardiovascular system: S1 & S2 heard, RRR.  Gastrointestinal system: Abdomen is nondistended,  soft and nontender. Central nervous system: Alert and oriented Extremities: 1-2+ bilateral lower extremity pitting edema, no clubbing ,no cyanosis Skin: No rashes, no ulcers,no icterus     Data Reviewed: I have personally reviewed following labs and imaging studies  CBC: Recent Labs  Lab 04/13/22 2045 04/17/22 0417 04/18/22 0410  WBC 7.2 6.2 4.5  NEUTROABS 3.9  --   --   HGB 13.0 11.7* 12.5  HCT 45.8 42.9 46.1*  MCV 85.0 85.8 86.2  PLT 185 160 381*   Basic Metabolic Panel: Recent Labs  Lab 04/15/22 0509 04/16/22 0404 04/17/22 0417 04/18/22 0410 04/19/22 0405  NA 140 138 139 140 140  K 3.9 3.3* 3.6 3.4* 3.1*  CL 93* 90* 90* 88* 89*  CO2 42* 41* 44* >45* 44*  GLUCOSE 99 81 72 75 83  BUN 10 12 11 12 14   CREATININE 0.57 0.50 0.46 0.48 0.49  CALCIUM 8.5* 8.4* 8.5* 8.8* 8.6*  MG  --   --  1.9 1.8  --      Recent Results (from the past 240 hour(s))  Culture, blood (Routine X 2) w Reflex to ID Panel     Status: None   Collection Time: 04/13/22  6:25 PM   Specimen: Right Antecubital; Blood  Result Value Ref Range Status   Specimen Description   Final  RIGHT ANTECUBITAL BLOOD Performed at [facility], [address] [place], [place] [address]    Special Requests   Final    BOTTLES DRAWN AEROBIC AND ANAEROBIC Blood Culture adequate volume Performed at [facility], [address], [place], [place] [address]    Culture   Final    NO GROWTH 5 DAYS Performed at [facility], [address], [place], [place] [address]    Report Status 04/18/2022 FINAL  Final  Culture, blood (Routine X 2) w Reflex to ID Panel     Status: None   Collection Time: 04/13/22  8:45 PM   Specimen: Left Antecubital; Blood  Result Value Ref Range Status   Specimen Description   Final    LEFT ANTECUBITAL BLOOD Performed at [facility], [address], [place], [place] [address]    Special Requests   Final    BOTTLES DRAWN AEROBIC AND ANAEROBIC Blood  Culture results may not be optimal due to an excessive volume of blood received in culture bottles Performed at [facility], [address], [place], [place] [address]    Culture   Final    NO GROWTH 5 DAYS Performed at [facility], [address], [place], [place] [address]    Report Status 04/19/2022 FINAL  Final  Resp Panel by RT-PCR (Flu A&B, Covid) Anterior Nasal Swab     Status: None   Collection Time: 04/13/22  8:50 PM   Specimen: Anterior Nasal Swab  Result Value Ref Range Status   SARS Coronavirus 2 by RT PCR NEGATIVE NEGATIVE Final    Comment: (NOTE) SARS-CoV-2 target nucleic acids are NOT DETECTED.  The SARS-CoV-2 RNA is generally detectable in upper respiratory specimens during the acute phase of infection. The lowest concentration of SARS-CoV-2 viral copies this assay can detect is 138 copies/mL. A negative result does not preclude SARS-Cov-2 infection and should not be used as the sole basis for treatment or other patient management decisions. A negative result may occur with  improper specimen collection/handling, submission of specimen other than nasopharyngeal swab, presence of viral mutation(s) within the areas targeted by this assay, and inadequate number of viral copies(<138 copies/mL). A negative result must be combined with clinical observations, patient history, and epidemiological information. The expected result is Negative.  Fact Sheet for Patients:  BloggerCourse.com  Fact Sheet for Healthcare Providers:  SeriousBroker.it  This test is no t yet approved or cleared by the [place] FDA and  has been authorized for detection and/or diagnosis of SARS-CoV-2 by FDA under an Emergency Use Authorization (EUA). This EUA will remain  in effect (meaning this test can be used) for the duration of the COVID-19 declaration under Section 564(b)(1) of the Act, 21 U.S.C.section  360bbb-3(b)(1), unless the authorization is terminated  or revoked sooner.       Influenza A by PCR NEGATIVE NEGATIVE Final   Influenza B by PCR NEGATIVE NEGATIVE Final    Comment: (NOTE) The Xpert Xpress SARS-CoV-2/FLU/RSV plus assay is intended as an aid in the diagnosis of influenza from Nasopharyngeal swab specimens and should not be used as a sole basis for treatment. Nasal washings and aspirates are unacceptable for Xpert Xpress SARS-CoV-2/FLU/RSV testing.  Fact Sheet for Patients: BloggerCourse.com  Fact Sheet for Healthcare Providers: SeriousBroker.it  This test is not yet approved or cleared by the [place] FDA and has been authorized for detection and/or diagnosis of SARS-CoV-2 by FDA under an Emergency  Use Authorization (EUA). This EUA will remain in effect (meaning this test can be used) for the duration of the COVID-19 declaration under Section 564(b)(1) of the Act, 21 U.S.C. section 360bbb-3(b)(1), unless the authorization is terminated or revoked.  Performed at [facility], [address] [place], [place] [address]   Urine Culture     Status: None   Collection Time: 04/14/22 12:18 AM   Specimen: Urine, Catheterized  Result Value Ref Range Status   Specimen Description   Final    URINE, CATHETERIZED Performed at [facility], [address], [place], [place] [address]    Special Requests   Final    NONE Performed at [facility], [address] [place], [place] [address]    Culture   Final    NO GROWTH Performed at [facility], [address] [place], [place] [address]    Report Status 04/15/2022 FINAL  Final  Gastrointestinal Panel by PCR , Stool     Status: Abnormal   Collection Time: 04/14/22  4:11 PM   Specimen: Stool  Result Value Ref Range Status   Campylobacter species DETECTED (A) NOT DETECTED Final    Comment: RESULT  CALLED TO, READ BACK BY AND VERIFIED WITH: Phillis Knack RN 1424 04/15/22 HNM    Plesimonas shigelloides NOT DETECTED NOT DETECTED Final   Salmonella species NOT DETECTED NOT DETECTED Final   Yersinia enterocolitica NOT DETECTED NOT DETECTED Final   Vibrio species NOT DETECTED NOT DETECTED Final   Vibrio cholerae NOT DETECTED NOT DETECTED Final   Enteroaggregative E coli (EAEC) NOT DETECTED NOT DETECTED Final   Enteropathogenic E coli (EPEC) NOT DETECTED NOT DETECTED Final   Enterotoxigenic E coli (ETEC) NOT DETECTED NOT DETECTED Final   Shiga like toxin producing E coli (STEC) NOT DETECTED NOT DETECTED Final   Shigella/Enteroinvasive E coli (EIEC) NOT DETECTED NOT DETECTED Final   Cryptosporidium NOT DETECTED NOT DETECTED Final   Cyclospora cayetanensis NOT DETECTED NOT DETECTED Final   Entamoeba histolytica NOT DETECTED NOT DETECTED Final   Giardia lamblia NOT DETECTED NOT DETECTED Final   Adenovirus F40/41 NOT DETECTED NOT DETECTED Final   Astrovirus NOT DETECTED NOT DETECTED Final   Norovirus GI/GII NOT DETECTED NOT DETECTED Final   Rotavirus A NOT DETECTED NOT DETECTED Final   Sapovirus (I, II, IV, and V) NOT DETECTED NOT DETECTED Final    Comment: Performed at [facility], [address], [place], [place] [address]     Radiology Studies: No results found.  Scheduled Meds:  divalproex  500 mg Oral QHS   enoxaparin (LOVENOX) injection  40 mg Subcutaneous Q24H   furosemide  40 mg Intravenous BID   liver oil-zinc oxide   Topical BID   loratadine  10 mg Oral QHS   metoprolol tartrate  12.5 mg Oral BID   nicotine  21 mg Transdermal Daily   potassium chloride  40 mEq Oral BID   venlafaxine XR  75 mg Oral Q breakfast   Continuous Infusions:   LOS: 6 days   Burnadette Pop, MD [facility] P10/21/2023, 11:18 AM

## 2022-04-20 NOTE — Plan of Care (Signed)

## 2022-04-21 DIAGNOSIS — I5031 Acute diastolic (congestive) heart failure: Secondary | ICD-10-CM | POA: Diagnosis not present

## 2022-04-21 DIAGNOSIS — Z9189 Other specified personal risk factors, not elsewhere classified: Secondary | ICD-10-CM

## 2022-04-21 DIAGNOSIS — J9622 Acute and chronic respiratory failure with hypercapnia: Secondary | ICD-10-CM

## 2022-04-21 DIAGNOSIS — I10 Essential (primary) hypertension: Secondary | ICD-10-CM | POA: Diagnosis not present

## 2022-04-21 DIAGNOSIS — J9621 Acute and chronic respiratory failure with hypoxia: Secondary | ICD-10-CM

## 2022-04-21 DIAGNOSIS — E876 Hypokalemia: Secondary | ICD-10-CM

## 2022-04-21 DIAGNOSIS — Z5919 Other inadequate housing: Secondary | ICD-10-CM

## 2022-04-21 DIAGNOSIS — F209 Schizophrenia, unspecified: Secondary | ICD-10-CM

## 2022-04-21 DIAGNOSIS — E66813 Obesity, class 3: Secondary | ICD-10-CM

## 2022-04-21 DIAGNOSIS — Z789 Other specified health status: Secondary | ICD-10-CM

## 2022-04-21 DIAGNOSIS — E873 Alkalosis: Secondary | ICD-10-CM

## 2022-04-21 DIAGNOSIS — E119 Type 2 diabetes mellitus without complications: Secondary | ICD-10-CM

## 2022-04-21 LAB — BASIC METABOLIC PANEL
Anion gap: 7 (ref 5–15)
BUN: 15 mg/dL (ref 8–23)
CO2: 44 mmol/L — ABNORMAL HIGH (ref 22–32)
Calcium: 9 mg/dL (ref 8.9–10.3)
Chloride: 88 mmol/L — ABNORMAL LOW (ref 98–111)
Creatinine, Ser: 0.59 mg/dL (ref 0.44–1.00)
GFR, Estimated: >60 mL/min (ref >60)
Glucose, Bld: 80 mg/dL (ref 70–99)
Potassium: 3.5 mmol/L (ref 3.5–5.1)
Sodium: 139 mmol/L (ref 135–145)

## 2022-04-21 MED ORDER — TORSEMIDE 20 MG PO TABS
40.0000 mg | ORAL_TABLET | Freq: Two times a day (BID) | ORAL | Status: DC
  Administered 2022-04-21 – 2022-04-23 (×4): 40 mg via ORAL
  Filled 2022-04-21 (×4): qty 2

## 2022-04-21 MED ORDER — SPIRONOLACTONE 12.5 MG HALF TABLET
12.5000 mg | ORAL_TABLET | Freq: Every day | ORAL | Status: DC
  Administered 2022-04-21: 12.5 mg via ORAL
  Filled 2022-04-21 (×2): qty 1

## 2022-04-21 NOTE — Progress Notes (Signed)
PROGRESS NOTE  Ellen Acevedo HWE:[id number] DOB: 1961/01/14   PCP: Nolene Ebbs, MD  Patient is from: Home.  Poor living condition.  DOA: 04/13/2022 LOS: 7  Chief complaints Chief Complaint  Patient presents with   Diarrhea   Failure To Thrive     Brief Narrative / Interim history: 61 year old F with PMH of schizophrenia, DM-2, morbid obesity, HTN, tobacco use disorder and HLD presenting with diarrhea, generalized abdominal pain, confusion, cough, shortness of breath, and admitted for acute respiratory failure with hypoxia and hypercapnia and acute on chronic diastolic CHF.  ABG with hypercapnia to 90 but normal pH.  BNP elevated to 970.  CTA chest negative for PE CAD and bilateral effusions with RUL and RLL airspace opacities.  CT abdomen with heterogeneous opacity throughout the liver and anasarca.  TTE with normal LVEF but calcified mitral valve.  GIP positive for Campylobacter.  Patient was treated with BiPAP and IV Lasix.  Symptoms improved.  Repeat recommended SNF.   Subjective: Seen and examined earlier this morning.  No major events overnight of this morning.  Patient's sister at bedside.  General feels better.  No further diarrhea.  Breathing improved.  She denies chest pain, nausea, vomiting or abdominal pain.  Objective: Vitals:   04/21/22 1200 04/21/22 1208 04/21/22 1300 04/21/22 1400  BP:  120/68    Pulse:  87    Resp: 20 (!) 22 13 14   Temp:  97.7 F (36.5 C)    TempSrc:  Oral    SpO2:  95%    Weight:      Height:        Examination:  GENERAL: No apparent distress.  Nontoxic. HEENT: MMM.  Vision and hearing grossly intact.  NECK: Supple.  Difficult to assess JVD due to body habitus. RESP:  No IWOB.  Fair aeration bilaterally. CVS:  RRR. Heart sounds normal.  ABD/GI/GU: BS+. Abd soft, NTND.  MSK/EXT:  Moves extremities. No apparent deformity.  Trace BLE edema. SKIN: no apparent skin lesion or wound NEURO: Awake, alert and oriented appropriately.  No  apparent focal neuro deficit. PSYCH: Calm. Normal affect.   Procedures:  None  Microbiology summarized: [id number] and influenza PCR nonreactive. Urine culture and blood culture negative. GIP with Campylobacter species  Assessment and plan: Principal Problem:   Acute hypoxemic respiratory failure (HCC) Active Problems:   Acute CHF (congestive heart failure) (HCC)   Diarrhea   CAD (coronary artery disease)   Schizophrenia (HCC)   Diabetes (HCC)   HTN (hypertension)   HLD (hyperlipidemia)   Acute on chronic respiratory failure with hypoxia and hypercapnia (HCC)  Acute on chronic hypoxic and hypercarbic respiratory failure: 84% on RA.  Not on oxygen at home.  ABG with hypercapnia with normal pH suggesting chronic obstructive etiology such as OHS/OSA or undiagnosed COPD.  CTA chest with some RUL and RLL opacity but Pro-Cal negative. -[facility] will follow patient on the outpatient setting for triology/Bipap. -Wean oxygen as able.  Minimum oxygen to keep saturation above 88%. -BiPAP at night and as needed.   Acute diastolic congestive heart failure: BNP elevated to 900s.  TTE with LVEF of 55 to 60%, mild cLVH, indeterminate DD, moderate pericardial effusion without tamponade and calcified mitral valve.  Diuresed with IV Lasix with improvement in her breathing and fluid status.  Net -28 L.  Heart function is stable. -Transition to torsemide 40 mg twice daily  -Start Aldactone 12.5 mg daily. -Strict intake and output, daily weight, renal functions and electrolytes  Acute  metabolic encephalopathy: Seems to have resolved.  CT head without acute finding.  No focal neurodeficit.  She has significant hypercapnia but compensated. -Reorientation and delirium precautions -Wean oxygen as above -Avoid or minimize sedating medications   Campylobacter gastroenteritis: Presented with diarrhea and generalized abdominal pain.  CT abdomen without evidence of colitis.  Diarrhea resolved.  Enteric precaution  discontinued.    Coronary artery disease: Noted on CT chest.  Complained of chest pain this morning now has resolved.  Troponins normal.  EKG without any findings suggestive of ischemic changes   Fatty liver disease: Right upper quadrant ultrasound showed mildly increased echogenicity throughout the liver.   Uterine abnormality: CT abdomen/pelvis with 1.4 cm enhancing nodule centrally within the uterus could reflect a submucosal fibroid or endometrial polyp.  Pelvic US limited due to morbid obesity but with endometrial thickness considered abnormal for asymptomatic postmenopausal result patient with recommendation of endometrial sampling to exclude carcinoma.   -Outpatient follow-up with GYN.    Controlled NIDDM-2: A1c of 6.5.  Not on medication at home. Recent Labs  Lab 04/16/22 1944 04/17/22 0836 04/17/22 1230 04/17/22 1650 04/17/22 2043  GLUCAP 141* 76 95 97 120*  -Continue current insulin regimen   Essential hypertension: Normotensive.  Not on meds at home. -Diuretics as above.   History of schizophrenia: Does not seem to be on antipsychotics.  Depakote and Effexor on home list. -Continue home medication  Hypokalemia: K3.5. -P.o. KCl 40x1 -Started low-dose Aldactone   Debility/deconditioning/poor living situation: found to have infestation with bedbugs, roaches at home.  Unable to take care of herself at baseline.  Family seeking guardianship.  Very poor living condition.  PT/OT recommending SNF on discharge.  TOC following  Morbid obesity Body mass index is 43.86 kg/m.           DVT prophylaxis:  enoxaparin (LOVENOX) injection 40 mg Start: 04/14/22 2200  Code Status: Full code Family Communication: Updated patient's sister at bedside. Level of care: Progressive Status is: Inpatient Remains inpatient appropriate because: Acute on chronic diastolic CHF and safe disposition.   Final disposition: SNF Consultants:  None  Sch Meds:  Scheduled Meds:  divalproex   500 mg Oral QHS   enoxaparin (LOVENOX) injection  40 mg Subcutaneous Q24H   furosemide  40 mg Intravenous BID   liver oil-zinc oxide   Topical BID   loratadine  10 mg Oral QHS   metoprolol tartrate  12.5 mg Oral BID   nicotine  21 mg Transdermal Daily   potassium chloride  40 mEq Oral BID   venlafaxine XR  75 mg Oral Q breakfast   Continuous Infusions: PRN Meds:.acetaminophen **OR** acetaminophen, albuterol, mouth rinse  Antimicrobials: Anti-infectives (From admission, onward)    Start     Dose/Rate Route Frequency Ordered Stop   04/13/22 1745  ceFEPIme (MAXIPIME) 2 g in sodium chloride 0.9 % 100 mL IVPB        2 g 200 mL/hr over 30 Minutes Intravenous  Once 04/13/22 1733 04/13/22 2219   04/13/22 1745  metroNIDAZOLE (FLAGYL) IVPB 500 mg        500 mg 100 mL/hr over 60 Minutes Intravenous  Once 04/13/22 1733 04/14/22 0652   04/13/22 1745  vancomycin (VANCOCIN) IVPB 1000 mg/200 mL premix        1,000 mg 200 mL/hr over 60 Minutes Intravenous  Once 04/13/22 1733 04/14/22 0653        I have personally reviewed the following labs and images: CBC: Recent Labs  Lab 04/17/22  0417 04/18/22 0410  WBC 6.2 4.5  HGB 11.7* 12.5  HCT 42.9 46.1*  MCV 85.8 86.2  PLT 160 147*   BMP &GFR Recent Labs  Lab 04/17/22 0417 04/18/22 0410 04/19/22 0405 04/20/22 0449 04/21/22 0426  NA 139 140 140 141 139  K 3.6 3.4* 3.1* 3.3* 3.5  CL 90* 88* 89* 88* 88*  CO2 44* >45* 44* >45* 44*  GLUCOSE 72 75 83 83 80  BUN 11 12 14 14 15   CREATININE 0.46 0.48 0.49 0.37* 0.59  CALCIUM 8.5* 8.8* 8.6* 8.9 9.0  MG 1.9 1.8  --   --   --    Estimated Creatinine Clearance: 92.3 mL/min (by C-G formula based on SCr of 0.59 mg/dL). Liver & Pancreas: No results for input(s): "AST", "ALT", "ALKPHOS", "BILITOT", "PROT", "ALBUMIN" in the last 168 hours. No results for input(s): "LIPASE", "AMYLASE" in the last 168 hours. No results for input(s): "AMMONIA" in the last 168 hours. Diabetic: No results for  input(s): "HGBA1C" in the last 72 hours. Recent Labs  Lab 04/16/22 1944 04/17/22 0836 04/17/22 1230 04/17/22 1650 04/17/22 2043  GLUCAP 141* 76 95 97 120*   Cardiac Enzymes: No results for input(s): "CKTOTAL", "CKMB", "CKMBINDEX", "TROPONINI" in the last 168 hours. No results for input(s): "PROBNP" in the last 8760 hours. Coagulation Profile: No results for input(s): "INR", "PROTIME" in the last 168 hours. Thyroid Function Tests: Recent Labs    04/20/22 1118  TSH 3.457   Lipid Profile: No results for input(s): "CHOL", "HDL", "LDLCALC", "TRIG", "CHOLHDL", "LDLDIRECT" in the last 72 hours. Anemia Panel: No results for input(s): "VITAMINB12", "FOLATE", "FERRITIN", "TIBC", "IRON", "RETICCTPCT" in the last 72 hours. Urine analysis:    Component Value Date/Time   COLORURINE YELLOW 04/14/2022 0018   APPEARANCEUR CLEAR 04/14/2022 0018   LABSPEC 1.005 04/14/2022 0018   PHURINE 6.0 04/14/2022 0018   GLUCOSEU NEGATIVE 04/14/2022 0018   HGBUR MODERATE (A) 04/14/2022 0018   BILIRUBINUR NEGATIVE 04/14/2022 0018   KETONESUR NEGATIVE 04/14/2022 0018   PROTEINUR NEGATIVE 04/14/2022 0018   UROBILINOGEN 1.0 11/30/2008 1156   NITRITE NEGATIVE 04/14/2022 0018   LEUKOCYTESUR MODERATE (A) 04/14/2022 0018   Sepsis Labs: Invalid input(s): "PROCALCITONIN", "LACTICIDVEN"  Microbiology: Recent Results (from the past 240 hour(s))  Culture, blood (Routine X 2) w Reflex to ID Panel     Status: None   Collection Time: 04/13/22  6:25 PM   Specimen: Right Antecubital; Blood  Result Value Ref Range Status   Specimen Description   Final    RIGHT ANTECUBITAL BLOOD Performed at [facility], [address] [place], [place] [address]    Special Requests   Final    BOTTLES DRAWN AEROBIC AND ANAEROBIC Blood Culture adequate volume Performed at [facility], [address] [place], [place] [address]    Culture   Final    NO GROWTH 5 DAYS Performed at [facility], [address] [place], [place] [address]    Report Status 04/18/2022 FINAL  Final  Culture, blood (Routine X 2) w Reflex to ID Panel     Status: None   Collection Time: 04/13/22  8:45 PM   Specimen: Left Antecubital; Blood  Result Value Ref Range Status   Specimen Description   Final    LEFT ANTECUBITAL BLOOD Performed at [facility], [address] [place], [place] [address]    Special Requests   Final    BOTTLES DRAWN AEROBIC AND ANAEROBIC Blood Culture  results may not be optimal due to an excessive volume of blood received in culture bottles Performed at [facility], [address] [place], [place] [address]    Culture   Final    NO GROWTH 5 DAYS Performed at [facility], [address] [place], [place] [address]    Report Status 04/19/2022 FINAL  Final  Resp Panel by RT-PCR (Flu A&B, Covid) Anterior Nasal Swab     Status: None   Collection Time: 04/13/22  8:50 PM   Specimen: Anterior Nasal Swab  Result Value Ref Range Status   SARS Coronavirus 2 by RT PCR NEGATIVE NEGATIVE Final    Comment: (NOTE) SARS-CoV-2 target nucleic acids are NOT DETECTED.  The SARS-CoV-2 RNA is generally detectable in upper respiratory specimens during the acute phase of infection. The lowest concentration of SARS-CoV-2 viral copies this assay can detect is 138 copies/mL. A negative result does not preclude SARS-Cov-2 infection and should not be used as the sole basis for treatment or other patient management decisions. A negative result may occur with  improper specimen collection/handling, submission of specimen other than nasopharyngeal swab, presence of viral mutation(s) within the areas targeted by this assay, and inadequate number of viral copies(<138 copies/mL). A negative result must be combined with clinical observations, patient history, and epidemiological information. The expected result is Negative.  Fact Sheet for Patients:   BloggerCourse.com  Fact Sheet for Healthcare Providers:  SeriousBroker.it  This test is no t yet approved or cleared by the [place] FDA and  has been authorized for detection and/or diagnosis of SARS-CoV-2 by FDA under an Emergency Use Authorization (EUA). This EUA will remain  in effect (meaning this test can be used) for the duration of the COVID-19 declaration under Section 564(b)(1) of the Act, 21 U.S.C.section 360bbb-3(b)(1), unless the authorization is terminated  or revoked sooner.       Influenza A by PCR NEGATIVE NEGATIVE Final   Influenza B by PCR NEGATIVE NEGATIVE Final    Comment: (NOTE) The Xpert Xpress SARS-CoV-2/FLU/RSV plus assay is intended as an aid in the diagnosis of influenza from Nasopharyngeal swab specimens and should not be used as a sole basis for treatment. Nasal washings and aspirates are unacceptable for Xpert Xpress SARS-CoV-2/FLU/RSV testing.  Fact Sheet for Patients: BloggerCourse.com  Fact Sheet for Healthcare Providers: SeriousBroker.it  This test is not yet approved or cleared by the [place] FDA and has been authorized for detection and/or diagnosis of SARS-CoV-2 by FDA under an Emergency Use Authorization (EUA). This EUA will remain in effect (meaning this test can be used) for the duration of the COVID-19 declaration under Section 564(b)(1) of the Act, 21 U.S.C. section 360bbb-3(b)(1), unless the authorization is terminated or revoked.  Performed at [facility], [address], [place], [place] [address]   Urine Culture     Status: None   Collection Time: 04/14/22 12:18 AM   Specimen: Urine, Catheterized  Result Value Ref Range Status   Specimen Description   Final    URINE, CATHETERIZED Performed at [facility], [address] [place], [place] [address]    Special Requests   Final     NONE Performed at [facility], [address], [place], [place] [address]    Culture   Final    NO GROWTH Performed at [facility], [address], [place], [place] [address]    Report Status 04/15/2022 FINAL  Final  Gastrointestinal Panel by PCR , Stool     Status: Abnormal   Collection Time: 04/14/22  4:11 PM   Specimen: Stool  Result Value Ref Range Status   Campylobacter species DETECTED (A) NOT DETECTED Final    Comment: RESULT CALLED TO, READ BACK BY AND VERIFIED WITH: Phillis Knack RN 1424 04/15/22 HNM    Plesimonas shigelloides NOT DETECTED NOT DETECTED Final   Salmonella species NOT DETECTED NOT DETECTED Final   Yersinia enterocolitica NOT DETECTED NOT DETECTED Final   Vibrio species NOT DETECTED NOT DETECTED Final   Vibrio cholerae NOT DETECTED NOT DETECTED Final   Enteroaggregative E coli (EAEC) NOT DETECTED NOT DETECTED Final   Enteropathogenic E coli (EPEC) NOT DETECTED NOT DETECTED Final   Enterotoxigenic E coli (ETEC) NOT DETECTED NOT DETECTED Final   Shiga like toxin producing E coli (STEC) NOT DETECTED NOT DETECTED Final   Shigella/Enteroinvasive E coli (EIEC) NOT DETECTED NOT DETECTED Final   Cryptosporidium NOT DETECTED NOT DETECTED Final   Cyclospora cayetanensis NOT DETECTED NOT DETECTED Final   Entamoeba histolytica NOT DETECTED NOT DETECTED Final   Giardia lamblia NOT DETECTED NOT DETECTED Final   Adenovirus F40/41 NOT DETECTED NOT DETECTED Final   Astrovirus NOT DETECTED NOT DETECTED Final   Norovirus GI/GII NOT DETECTED NOT DETECTED Final   Rotavirus A NOT DETECTED NOT DETECTED Final   Sapovirus (I, II, IV, and V) NOT DETECTED NOT DETECTED Final    Comment: Performed at [facility], [address], [place], [place] [address]    Radiology Studies: No results found.    Aizen Duval T. Nathanyl Andujo [facility]  If 7PM-7AM, please contact night-coverage [email or web address] 04/21/2022, 3:05 PM

## 2022-04-21 NOTE — TOC Progression Note (Signed)
Transition of Care [facility]) - Progression Note    Patient Details  Name: Ellen Acevedo MRN: [id number] Date of Birth: 11/27/1960  Transition of Care [facility]) CM/SW Contact  Ross Ludwig, [place] Phone Number: 04/21/2022, 10:26 AM  Clinical Narrative:     [facility] number still pending, patient can not go to SNF until [facility] number is received.  Expected Discharge Plan: [place] Barriers to Discharge: Continued Medical Work up  Expected Discharge Plan and Services Expected Discharge Plan: [place] In-house Referral: NA Discharge Planning Services: CM Consult Post Acute Care Choice: NA Living arrangements for the past 2 months: Apartment                   DME Agency: NA       HH Arranged: NA HH Agency: NA         Social Determinants of Health (SDOH) Interventions    Readmission Risk Interventions     No data to display

## 2022-04-21 NOTE — Progress Notes (Signed)
Patient refuses to wear BIPAP until she talks to her doctor.

## 2022-04-22 DIAGNOSIS — I5031 Acute diastolic (congestive) heart failure: Secondary | ICD-10-CM | POA: Diagnosis not present

## 2022-04-22 DIAGNOSIS — F209 Schizophrenia, unspecified: Secondary | ICD-10-CM | POA: Diagnosis not present

## 2022-04-22 DIAGNOSIS — I10 Essential (primary) hypertension: Secondary | ICD-10-CM | POA: Diagnosis not present

## 2022-04-22 DIAGNOSIS — Z5919 Other inadequate housing: Secondary | ICD-10-CM

## 2022-04-22 DIAGNOSIS — I5033 Acute on chronic diastolic (congestive) heart failure: Secondary | ICD-10-CM

## 2022-04-22 DIAGNOSIS — J9621 Acute and chronic respiratory failure with hypoxia: Secondary | ICD-10-CM | POA: Diagnosis not present

## 2022-04-22 DIAGNOSIS — A09 Infectious gastroenteritis and colitis, unspecified: Secondary | ICD-10-CM

## 2022-04-22 DIAGNOSIS — Z789 Other specified health status: Secondary | ICD-10-CM

## 2022-04-22 LAB — COMPREHENSIVE METABOLIC PANEL
ALT: 14 U/L (ref 0–44)
AST: 18 U/L (ref 15–41)
Albumin: 2.8 g/dL — ABNORMAL LOW (ref 3.5–5.0)
Alkaline Phosphatase: 72 U/L (ref 38–126)
Anion gap: 10 (ref 5–15)
BUN: 14 mg/dL (ref 8–23)
CO2: 44 mmol/L — ABNORMAL HIGH (ref 22–32)
Calcium: 8.9 mg/dL (ref 8.9–10.3)
Chloride: 84 mmol/L — ABNORMAL LOW (ref 98–111)
Creatinine, Ser: 0.48 mg/dL (ref 0.44–1.00)
GFR, Estimated: >60 mL/min (ref >60)
Glucose, Bld: 85 mg/dL (ref 70–99)
Potassium: 3.5 mmol/L (ref 3.5–5.1)
Sodium: 138 mmol/L (ref 135–145)
Total Bilirubin: 0.7 mg/dL (ref 0.3–1.2)
Total Protein: 6.6 g/dL (ref 6.5–8.1)

## 2022-04-22 LAB — PHOSPHORUS: Phosphorus: 3.6 mg/dL (ref 2.5–4.6)

## 2022-04-22 LAB — CBC
HCT: 47.2 % — ABNORMAL HIGH (ref 36.0–46.0)
Hemoglobin: 12.7 g/dL (ref 12.0–15.0)
MCH: 23.2 pg — ABNORMAL LOW (ref 26.0–34.0)
MCHC: 26.9 g/dL — ABNORMAL LOW (ref 30.0–36.0)
MCV: 86.3 fL (ref 80.0–100.0)
Platelets: 165 10*3/uL (ref 150–400)
RBC: 5.47 MIL/uL — ABNORMAL HIGH (ref 3.87–5.11)
RDW: 20.3 % — ABNORMAL HIGH (ref 11.5–15.5)
WBC: 5.1 10*3/uL (ref 4.0–10.5)
nRBC: 0 % (ref 0.0–0.2)

## 2022-04-22 LAB — MAGNESIUM: Magnesium: 1.8 mg/dL (ref 1.7–2.4)

## 2022-04-22 LAB — BRAIN NATRIURETIC PEPTIDE: B Natriuretic Peptide: 422 pg/mL — ABNORMAL HIGH (ref 0.0–100.0)

## 2022-04-22 MED ORDER — SPIRONOLACTONE 25 MG PO TABS
25.0000 mg | ORAL_TABLET | Freq: Every day | ORAL | Status: DC
  Administered 2022-04-22 – 2022-04-26 (×5): 25 mg via ORAL
  Filled 2022-04-22 (×5): qty 1

## 2022-04-22 NOTE — TOC Progression Note (Signed)
Transition of Care [facility]) - Progression Note    Patient Details  Name: Ellen Acevedo MRN: [id number] Date of Birth: 11-22-1960  Transition of Care [facility]) CM/SW [place], RN Phone Number: 04/22/2022, 11:13 AM  Clinical Narrative:   Received inbound call from patient's sister [name], advised still awaiting PASRR review. Will await PASSR, patient can not go to a SNF until PASRR review has been completed. Patient's sister [name] verbalized understanding.   TOC will continue to follow.      Expected Discharge Plan: [place] Barriers to Discharge: Continued Medical Work up  Expected Discharge Plan and Services Expected Discharge Plan: [place] In-house Referral: NA Discharge Planning Services: CM Consult Post Acute Care Choice: NA Living arrangements for the past 2 months: Apartment                   DME Agency: NA       HH Arranged: NA HH Agency: NA         Social Determinants of Health (SDOH) Interventions    Readmission Risk Interventions     No data to display

## 2022-04-22 NOTE — Progress Notes (Signed)
Patient stated her MD is aware, and refuses to wear cpap or bipap at all.  Stated she feels claustrophobic with mask on.

## 2022-04-22 NOTE — Progress Notes (Signed)
PT Cancellation Note  Patient Details Name: Ellen Acevedo MRN: [id number] DOB: 01/29/1961   Cancelled Treatment:    Reason Eval/Treat Not Completed:  2 attempts to work with patient today-on 1st attempt she was speaking with nursing home rep. On 2nd time, she was eating. Will check back another day.    [facility]  Office: [phone]

## 2022-04-22 NOTE — Progress Notes (Signed)
PROGRESS NOTE  Ellen Acevedo HGD:[id number] DOB: [birth year]   PCP: Nolene Ebbs, MD  Patient is from: Home.  Poor living condition.  DOA: 04/13/2022 LOS: 8  Chief complaints Chief Complaint  Patient presents with   Diarrhea   Failure To Thrive     Brief Narrative / Interim history: 61 year old F with PMH of schizophrenia, DM-2, morbid obesity, HTN, tobacco use disorder and HLD presenting with diarrhea, generalized abdominal pain, confusion, cough, shortness of breath, and admitted for acute respiratory failure with hypoxia and hypercapnia and acute on chronic diastolic CHF.  ABG with hypercapnia to 90 but normal pH.  BNP elevated to 970.  CTA chest negative for PE CAD and bilateral effusions with RUL and RLL airspace opacities.  CT abdomen with heterogeneous opacity throughout the liver and anasarca.  TTE with normal LVEF but calcified mitral valve.  GIP positive for Campylobacter.  Patient was treated with BiPAP and IV Lasix.  Symptoms improved.  Therapy recommended SNF. Medically optimized for discharge.   Subjective: Seen and examined earlier this morning.  No major events overnight of this morning.  She reports feeling anxious about going to rehab.  Denies shortness of breath, chest pain, GI or UTI symptoms.  Objective: Vitals:   04/21/22 1950 04/22/22 0354 04/22/22 0414 04/22/22 1240  BP: 129/75 138/74  (!) 141/82  Pulse: 99 (!) 103  (!) 105  Resp: 18 19  18   Temp: 97.8 F (36.6 C) 99.3 F (37.4 C)  98.4 F (36.9 C)  TempSrc: Oral Oral    SpO2: 94% 90%  90%  Weight:   115.8 kg   Height:        Examination:  GENERAL: No apparent distress.  Nontoxic. HEENT: MMM.  Vision and hearing grossly intact.  NECK: Supple.  No apparent JVD.  RESP:  No IWOB.  Fair aeration bilaterally. CVS:  RRR. Heart sounds normal.  ABD/GI/GU: BS+. Abd soft, NTND.  MSK/EXT:  Moves extremities. No apparent deformity. No edema.  SKIN: no apparent skin lesion or wound NEURO: Awake and  alert. Oriented appropriately.  No apparent focal neuro deficit. PSYCH: Calm. Normal affect.   Procedures:  None  Microbiology summarized: [id number] and influenza PCR nonreactive. Urine culture and blood culture negative. GIP with Campylobacter species  Assessment and plan: Principal Problem:   Acute on chronic respiratory failure with hypoxia and hypercapnia (HCC) Active Problems:   Acute on chronic diastolic CHF (congestive heart failure) (HCC)   Infectious diarrhea   CAD (coronary artery disease)   Schizophrenia (HCC)   Controlled type 2 diabetes mellitus without complication, without long-term current use of insulin (HCC)   HTN (hypertension)   HLD (hyperlipidemia)   Obesity, Class III, BMI 40-49.9 (morbid obesity) ([place])   At risk for sleep apnea   Difficulty with activities of daily living   Unsatisfactory living conditions  Acute on chronic hypoxic and hypercarbic respiratory failure: 84% on RA.  Not on oxygen at home.  ABG with hypercapnia with normal pH suggesting chronic obstructive etiology such as OHS/OSA or undiagnosed COPD.  CTA chest with some RUL and RLL opacity but Pro-Cal negative.  -[facility] will follow patient on the outpatient setting for triology/Bipap. -Wean oxygen as able.  Minimum oxygen to keep saturation above 88%. -BiPAP as needed and at night but patient is noncompliant partly due to anxiety   Acute diastolic congestive heart failure: BNP elevated to 900s.  TTE with LVEF of 55 to 60%, mild cLVH, indeterminate DD, moderate pericardial effusion without tamponade and  calcified mitral valve.  Diuresed with IV Lasix and transition to p.o. torsemide.  Net -33 L.  Weight down from 312 to 255 pounds.  Renal function stable -Continue torsemide 40 mg twice daily -Increase Aldactone to 25 mg daily -Strict intake and output, daily weight, renal functions and electrolytes  Acute metabolic encephalopathy: Seems to have resolved.  CT head without acute finding.  No focal  neurodeficit.  She has significant hypercapnia but compensated. -Reorientation and delirium precautions -Wean oxygen as above.  Minimum to keep saturation above 88% -Avoid or minimize sedating medications   Campylobacter gastroenteritis: Presented with diarrhea and generalized abdominal pain.  CT abdomen without evidence of colitis.  Diarrhea resolved.  Enteric precaution discontinued.    Coronary artery disease: Noted on CT chest.  Complained of chest pain this morning now has resolved.  Troponins normal.  EKG without any findings suggestive of ischemic changes   Fatty liver disease: Right upper quadrant ultrasound showed mildly increased echogenicity throughout the liver.   Uterine abnormality: CT abdomen/pelvis with 1.4 cm enhancing nodule centrally within the uterus could reflect a submucosal fibroid or endometrial polyp.  Pelvic US limited due to morbid obesity but with endometrial thickness considered abnormal for asymptomatic postmenopausal result patient with recommendation of endometrial sampling to exclude carcinoma.   -Outpatient follow-up with GYN.    Controlled NIDDM-2: A1c of 6.5.  Not on medication at home. Recent Labs  Lab 04/16/22 1944 04/17/22 0836 04/17/22 1230 04/17/22 1650 04/17/22 2043  GLUCAP 141* 76 95 97 120*  -Continue current insulin regimen   Essential hypertension: Normotensive.  Not on meds at home. -Diuretics as above.   History of schizophrenia: Does not seem to be on antipsychotics.  Depakote and Effexor on home list. -Continue home medication  Hypokalemia: K3.6. -P.o. KCl 40x1 -Increased Aldactone.   Debility/deconditioning/poor living situation: found to have infestation with bedbugs, roaches at home.  Unable to take care of herself at baseline.  Family seeking guardianship.  Very poor living condition.  PT/OT recommending SNF on discharge.  TOC following  Morbid obesity Body mass index is 43.82 kg/m.           DVT prophylaxis:   enoxaparin (LOVENOX) injection 40 mg Start: 04/14/22 2200  Code Status: Full code Family Communication: None at bedside. Level of care: Progressive Status is: Inpatient Remains inpatient appropriate because: Acute on chronic diastolic CHF and safe disposition.   Final disposition: SNF Consultants:  None  Sch Meds:  Scheduled Meds:  divalproex  500 mg Oral QHS   enoxaparin (LOVENOX) injection  40 mg Subcutaneous Q24H   liver oil-zinc oxide   Topical BID   loratadine  10 mg Oral QHS   nicotine  21 mg Transdermal Daily   potassium chloride  40 mEq Oral BID   spironolactone  25 mg Oral Daily   torsemide  40 mg Oral BID   venlafaxine XR  75 mg Oral Q breakfast   Continuous Infusions: PRN Meds:.acetaminophen **OR** acetaminophen, albuterol, mouth rinse  Antimicrobials: Anti-infectives (From admission, onward)    Start     Dose/Rate Route Frequency Ordered Stop   04/13/22 1745  ceFEPIme (MAXIPIME) 2 g in sodium chloride 0.9 % 100 mL IVPB        2 g 200 mL/hr over 30 Minutes Intravenous  Once 04/13/22 1733 04/13/22 2219   04/13/22 1745  metroNIDAZOLE (FLAGYL) IVPB 500 mg        500 mg 100 mL/hr over 60 Minutes Intravenous  Once 04/13/22 1733 04/14/22  [id number]   04/13/22 1745  vancomycin (VANCOCIN) IVPB 1000 mg/200 mL premix        1,000 mg 200 mL/hr over 60 Minutes Intravenous  Once 04/13/22 1733 04/14/22 0653        I have personally reviewed the following labs and images: CBC: Recent Labs  Lab 04/17/22 0417 04/18/22 0410 04/22/22 0402  WBC 6.2 4.5 5.1  HGB 11.7* 12.5 12.7  HCT 42.9 46.1* 47.2*  MCV 85.8 86.2 86.3  PLT 160 147* 165   BMP &GFR Recent Labs  Lab 04/17/22 0417 04/18/22 0410 04/19/22 0405 04/20/22 0449 04/21/22 0426 04/22/22 0402  NA 139 140 140 141 139 138  K 3.6 3.4* 3.1* 3.3* 3.5 3.5  CL 90* 88* 89* 88* 88* 84*  CO2 44* >45* 44* >45* 44* 44*  GLUCOSE 72 75 83 83 80 85  BUN 11 12 14 14 15 14   CREATININE 0.46 0.48 0.49 0.37* 0.59 0.48   CALCIUM 8.5* 8.8* 8.6* 8.9 9.0 8.9  MG 1.9 1.8  --   --   --  1.8  PHOS  --   --   --   --   --  3.6   Estimated Creatinine Clearance: 92.2 mL/min (by C-G formula based on SCr of 0.48 mg/dL). Liver & Pancreas: Recent Labs  Lab 04/22/22 0402  AST 18  ALT 14  ALKPHOS 72  BILITOT 0.7  PROT 6.6  ALBUMIN 2.8*   No results for input(s): "LIPASE", "AMYLASE" in the last 168 hours. No results for input(s): "AMMONIA" in the last 168 hours. Diabetic: No results for input(s): "HGBA1C" in the last 72 hours. Recent Labs  Lab 04/16/22 1944 04/17/22 0836 04/17/22 1230 04/17/22 1650 04/17/22 2043  GLUCAP 141* 76 95 97 120*   Cardiac Enzymes: No results for input(s): "CKTOTAL", "CKMB", "CKMBINDEX", "TROPONINI" in the last 168 hours. No results for input(s): "PROBNP" in the last 8760 hours. Coagulation Profile: No results for input(s): "INR", "PROTIME" in the last 168 hours. Thyroid Function Tests: Recent Labs    04/20/22 1118  TSH 3.457   Lipid Profile: No results for input(s): "CHOL", "HDL", "LDLCALC", "TRIG", "CHOLHDL", "LDLDIRECT" in the last 72 hours. Anemia Panel: No results for input(s): "VITAMINB12", "FOLATE", "FERRITIN", "TIBC", "IRON", "RETICCTPCT" in the last 72 hours. Urine analysis:    Component Value Date/Time   COLORURINE YELLOW 04/14/2022 0018   APPEARANCEUR CLEAR 04/14/2022 0018   LABSPEC 1.005 04/14/2022 0018   PHURINE 6.0 04/14/2022 0018   GLUCOSEU NEGATIVE 04/14/2022 0018   HGBUR MODERATE (A) 04/14/2022 0018   BILIRUBINUR NEGATIVE 04/14/2022 0018   KETONESUR NEGATIVE 04/14/2022 0018   PROTEINUR NEGATIVE 04/14/2022 0018   UROBILINOGEN 1.0 11/30/2008 1156   NITRITE NEGATIVE 04/14/2022 0018   LEUKOCYTESUR MODERATE (A) 04/14/2022 0018   Sepsis Labs: Invalid input(s): "PROCALCITONIN", "LACTICIDVEN"  Microbiology: Recent Results (from the past 240 hour(s))  Culture, blood (Routine X 2) w Reflex to ID Panel     Status: None   Collection Time: 04/13/22   6:25 PM   Specimen: Right Antecubital; Blood  Result Value Ref Range Status   Specimen Description   Final    RIGHT ANTECUBITAL BLOOD Performed at [facility], [address] [place], [place] [address]    Special Requests   Final    BOTTLES DRAWN AEROBIC AND ANAEROBIC Blood Culture adequate volume Performed at [facility], [address] [place], [place] [address]    Culture   Final    NO GROWTH 5 DAYS  Performed at [facility], [address] [place], [place] [address]    Report Status 04/18/2022 FINAL  Final  Culture, blood (Routine X 2) w Reflex to ID Panel     Status: None   Collection Time: 04/13/22  8:45 PM   Specimen: Left Antecubital; Blood  Result Value Ref Range Status   Specimen Description   Final    LEFT ANTECUBITAL BLOOD Performed at [facility], [address], [place], [place] [address]    Special Requests   Final    BOTTLES DRAWN AEROBIC AND ANAEROBIC Blood Culture results may not be optimal due to an excessive volume of blood received in culture bottles Performed at [facility], [address], [place], [place] [address]    Culture   Final    NO GROWTH 5 DAYS Performed at [facility], [address], [place], [place] [address]    Report Status 04/19/2022 FINAL  Final  Resp Panel by RT-PCR (Flu A&B, Covid) Anterior Nasal Swab     Status: None   Collection Time: 04/13/22  8:50 PM   Specimen: Anterior Nasal Swab  Result Value Ref Range Status   SARS Coronavirus 2 by RT PCR NEGATIVE NEGATIVE Final    Comment: (NOTE) SARS-CoV-2 target nucleic acids are NOT DETECTED.  The SARS-CoV-2 RNA is generally detectable in upper respiratory specimens during the acute phase of infection. The lowest concentration of SARS-CoV-2 viral copies this assay can detect is 138 copies/mL. A negative result does not preclude SARS-Cov-2 infection and should not be used as the sole basis for treatment or other  patient management decisions. A negative result may occur with  improper specimen collection/handling, submission of specimen other than nasopharyngeal swab, presence of viral mutation(s) within the areas targeted by this assay, and inadequate number of viral copies(<138 copies/mL). A negative result must be combined with clinical observations, patient history, and epidemiological information. The expected result is Negative.  Fact Sheet for Patients:  BloggerCourse.com  Fact Sheet for Healthcare Providers:  SeriousBroker.it  This test is no t yet approved or cleared by the [place] FDA and  has been authorized for detection and/or diagnosis of SARS-CoV-2 by FDA under an Emergency Use Authorization (EUA). This EUA will remain  in effect (meaning this test can be used) for the duration of the COVID-19 declaration under Section 564(b)(1) of the Act, 21 U.S.C.section 360bbb-3(b)(1), unless the authorization is terminated  or revoked sooner.       Influenza A by PCR NEGATIVE NEGATIVE Final   Influenza B by PCR NEGATIVE NEGATIVE Final    Comment: (NOTE) The Xpert Xpress SARS-CoV-2/FLU/RSV plus assay is intended as an aid in the diagnosis of influenza from Nasopharyngeal swab specimens and should not be used as a sole basis for treatment. Nasal washings and aspirates are unacceptable for Xpert Xpress SARS-CoV-2/FLU/RSV testing.  Fact Sheet for Patients: BloggerCourse.com  Fact Sheet for Healthcare Providers: SeriousBroker.it  This test is not yet approved or cleared by the [place] FDA and has been authorized for detection and/or diagnosis of SARS-CoV-2 by FDA under an Emergency Use Authorization (EUA). This EUA will remain in effect (meaning this test can be used) for the duration of the COVID-19 declaration under Section 564(b)(1) of the Act, 21 U.S.C. section  360bbb-3(b)(1), unless the authorization is terminated or revoked.  Performed at [facility], [address] [place], [place] [address]   Urine Culture     Status: None  Collection Time: 04/14/22 12:18 AM   Specimen: Urine, Catheterized  Result Value Ref Range Status   Specimen Description   Final    URINE, CATHETERIZED Performed at [facility], [address], [place], [place] [address]    Special Requests   Final    NONE Performed at [facility], [address] [place], [place] [address]    Culture   Final    NO GROWTH Performed at [facility], [address], [place], [place] [address]    Report Status 04/15/2022 FINAL  Final  Gastrointestinal Panel by PCR , Stool     Status: Abnormal   Collection Time: 04/14/22  4:11 PM   Specimen: Stool  Result Value Ref Range Status   Campylobacter species DETECTED (A) NOT DETECTED Final    Comment: RESULT CALLED TO, READ BACK BY AND VERIFIED WITH: Phillis Knack RN 1424 04/15/22 HNM    Plesimonas shigelloides NOT DETECTED NOT DETECTED Final   Salmonella species NOT DETECTED NOT DETECTED Final   Yersinia enterocolitica NOT DETECTED NOT DETECTED Final   Vibrio species NOT DETECTED NOT DETECTED Final   Vibrio cholerae NOT DETECTED NOT DETECTED Final   Enteroaggregative E coli (EAEC) NOT DETECTED NOT DETECTED Final   Enteropathogenic E coli (EPEC) NOT DETECTED NOT DETECTED Final   Enterotoxigenic E coli (ETEC) NOT DETECTED NOT DETECTED Final   Shiga like toxin producing E coli (STEC) NOT DETECTED NOT DETECTED Final   Shigella/Enteroinvasive E coli (EIEC) NOT DETECTED NOT DETECTED Final   Cryptosporidium NOT DETECTED NOT DETECTED Final   Cyclospora cayetanensis NOT DETECTED NOT DETECTED Final   Entamoeba histolytica NOT DETECTED NOT DETECTED Final   Giardia lamblia NOT DETECTED NOT DETECTED Final   Adenovirus F40/41 NOT DETECTED NOT DETECTED Final   Astrovirus NOT  DETECTED NOT DETECTED Final   Norovirus GI/GII NOT DETECTED NOT DETECTED Final   Rotavirus A NOT DETECTED NOT DETECTED Final   Sapovirus (I, II, IV, and V) NOT DETECTED NOT DETECTED Final    Comment: Performed at [facility], [address] [place], [place] [address]    Radiology Studies: No results found.    Kery Haltiwanger T. Jeana Kersting [facility]  If 7PM-7AM, please contact night-coverage [email or web address] 04/22/2022, 2:17 PM

## 2022-04-23 DIAGNOSIS — I10 Essential (primary) hypertension: Secondary | ICD-10-CM | POA: Diagnosis not present

## 2022-04-23 DIAGNOSIS — I5031 Acute diastolic (congestive) heart failure: Secondary | ICD-10-CM | POA: Diagnosis not present

## 2022-04-23 DIAGNOSIS — J9621 Acute and chronic respiratory failure with hypoxia: Secondary | ICD-10-CM | POA: Diagnosis not present

## 2022-04-23 DIAGNOSIS — F209 Schizophrenia, unspecified: Secondary | ICD-10-CM | POA: Diagnosis not present

## 2022-04-23 LAB — RENAL FUNCTION PANEL
Albumin: 2.8 g/dL — ABNORMAL LOW (ref 3.5–5.0)
BUN: 16 mg/dL (ref 8–23)
CO2: >45 mmol/L — ABNORMAL HIGH (ref 22–32)
Calcium: 9.1 mg/dL (ref 8.9–10.3)
Chloride: 83 mmol/L — ABNORMAL LOW (ref 98–111)
Creatinine, Ser: 0.64 mg/dL (ref 0.44–1.00)
GFR, Estimated: >60 mL/min (ref >60)
Glucose, Bld: 97 mg/dL (ref 70–99)
Phosphorus: 3.5 mg/dL (ref 2.5–4.6)
Potassium: 3.4 mmol/L — ABNORMAL LOW (ref 3.5–5.1)
Sodium: 137 mmol/L (ref 135–145)

## 2022-04-23 LAB — CBC
HCT: 47.4 % — ABNORMAL HIGH (ref 36.0–46.0)
Hemoglobin: 12.8 g/dL (ref 12.0–15.0)
MCH: 23.3 pg — ABNORMAL LOW (ref 26.0–34.0)
MCHC: 27 g/dL — ABNORMAL LOW (ref 30.0–36.0)
MCV: 86.2 fL (ref 80.0–100.0)
Platelets: 172 10*3/uL (ref 150–400)
RBC: 5.5 MIL/uL — ABNORMAL HIGH (ref 3.87–5.11)
RDW: 21 % — ABNORMAL HIGH (ref 11.5–15.5)
WBC: 5.7 10*3/uL (ref 4.0–10.5)
nRBC: 0 % (ref 0.0–0.2)

## 2022-04-23 LAB — BRAIN NATRIURETIC PEPTIDE: B Natriuretic Peptide: 286.5 pg/mL — ABNORMAL HIGH (ref 0.0–100.0)

## 2022-04-23 LAB — MAGNESIUM: Magnesium: 1.8 mg/dL (ref 1.7–2.4)

## 2022-04-23 MED ORDER — METOPROLOL TARTRATE 25 MG PO TABS
12.5000 mg | ORAL_TABLET | Freq: Two times a day (BID) | ORAL | Status: DC
  Administered 2022-04-23 – 2022-04-26 (×8): 12.5 mg via ORAL
  Filled 2022-04-23 (×8): qty 1

## 2022-04-23 MED ORDER — TORSEMIDE 20 MG PO TABS
40.0000 mg | ORAL_TABLET | Freq: Every day | ORAL | Status: DC
  Administered 2022-04-24 – 2022-04-26 (×3): 40 mg via ORAL
  Filled 2022-04-23 (×3): qty 2

## 2022-04-23 MED ORDER — POTASSIUM CHLORIDE CRYS ER 20 MEQ PO TBCR
40.0000 meq | EXTENDED_RELEASE_TABLET | ORAL | Status: AC
  Administered 2022-04-23 (×2): 40 meq via ORAL
  Filled 2022-04-23 (×2): qty 2

## 2022-04-23 NOTE — TOC Progression Note (Addendum)
Transition of Care [facility]) - Progression Note    Patient Details  Name: Ellen Acevedo MRN: [id number] Date of Birth: [birth year]  Transition of Care [facility]) CM/SW Contact  Roseanne Kaufman, RN Phone Number: 04/23/2022, 11:10 AM  Clinical Narrative:   Reviewed chart, PASRR is still pending.   - 4:45pm Spoke with Jolanta M.who reports patient advised Jolanta that paperwork was served to her re: capacity. This RNCM advised unaware of that information. [facility] is currently awaiting PASRR review outcome before moving forward with short term SNF placement.   TOC will continue follow.  Expected Discharge Plan: [place] Barriers to Discharge: Continued Medical Work up  Expected Discharge Plan and Services Expected Discharge Plan: [place] In-house Referral: NA Discharge Planning Services: CM Consult Post Acute Care Choice: NA Living arrangements for the past 2 months: Apartment                   DME Agency: NA       HH Arranged: NA HH Agency: NA         Social Determinants of Health (SDOH) Interventions    Readmission Risk Interventions     No data to display

## 2022-04-23 NOTE — Progress Notes (Signed)
Occupational Therapy Treatment Patient Details Name: Ellen Acevedo MRN: [id number] DOB: 09/18/1960 Today's Date: 04/23/2022   History of present illness Ellen Acevedo is a 61 y.o. female with class III obesity and no documented past medical history presented to the ED via EMS from home for evaluation of diarrhea, confusion, cough, and shortness of breath.  Police Department was called for wellness check and patient found at home with bedbugs and roaches.  SPO2 77% on room air with EMS, improved with 4 L O2.  Vital signs on arrival to the ED: Temperature 98.1 F, heart rate 106, respiratory rate 23, blood pressure 159/101, SPO2 100% on 4 L O2.  Labs showing no leukocytosis, lactic acid 2.2 > 0.7, blood cultures drawn, troponin 19> 18, BNP 973, COVID and influenza PCR negative.  UA with negative nitrite, moderate leukocytes, and microscopy showing 0-5 WBCs and no bacteria.  Urine culture pending.  VBG showing pH 7.35, PCO2 80.  She was placed on BiPAP.  CTA chest negative for PE.  Showing aortic atherosclerosis, coronary artery disease, mild cardiomegaly, bilateral pleural effusions, and right upper and lower lobe airspace opacities suspicious for pneumonia versus atelectasis.  CT abdomen pelvis showing heterogeneous enhancement throughout the liver, 1.4 cm nodule within the uterus which could reflect a submucosal fibroid or endometrial polyp.  Pelvic ultrasound recommended for further evaluation.  CT also showing diffuse anasarca throughout the abdominal wall and aortic atherosclerosis.  CT head negative for acute finding. Patient was given vancomycin, cefepime, metronidazole, and 1 L normal saline bolus.     History limited as patient is currently on BiPAP.  He reports diarrhea for the past 3 weeks and mild generalized abdominal pain.  Reports shortness of breath and bilateral lower extremity edema for several months.  Denies chest pain.  She reports medical history of schizophrenia, diabetes,  hypertension, hyperlipidemia, and "heart problems."   OT comments  Patient was motivated to participate in the session. She performed supine to sit & sit to stand and with supervision. She took lateral steps towards the [facility] with min guard assist. She was instructed on therapeutic exercises for strengthening and endurance training needed to facilitate progressive ADL performance; she performed multiple reps and set of exercises with supervision seated EOB, demonstrating good tolerance. She is making gradual functional progress.    Recommendations for follow up therapy are one component of a multi-disciplinary discharge planning process, led by the attending physician.  Recommendations may be updated based on patient status, additional functional criteria and insurance authorization.    Follow Up Recommendations  Skilled nursing-short term rehab (<3 hours/day)    Assistance Recommended at Discharge Intermittent Supervision/Assistance  Patient can return home with the following  A little help with walking and/or transfers;A lot of help with bathing/dressing/bathroom;Assist for transportation;Assistance with cooking/housework   Equipment Recommendations  Tub/shower seat       Precautions / Restrictions Precautions Precautions: Fall Precaution Comments: monitor O2 Restrictions Weight Bearing Restrictions: No       Mobility Bed Mobility Overal bed mobility: Needs Assistance Bed Mobility: Supine to Sit, Sit to Supine     Supine to sit: HOB elevated, Supervision Sit to supine: Supervision        Transfers Overall transfer level: Needs assistance Equipment used: None Transfers: Sit to/from Stand Sit to Stand: Supervision, From elevated surface           General transfer comment: She took 3 lateral steps towards the [facility] with min guard assist     Balance  ADL either performed or assessed with clinical judgement   ADL   Eating/Feeding:  Independent Eating/Feeding Details (indicate cue type and reason): based on clinical judgement Grooming: Set up;Sitting           Upper Body Dressing : Set up;Sitting                              Cognition Arousal/Alertness: Awake/alert Behavior During Therapy: WFL for tasks assessed/performed Overall Cognitive Status: Within Functional Limits for tasks assessed                              Pertinent Vitals/ Pain       Pain Assessment Pain Assessment: No/denies pain         Frequency  Min 2X/week        Progress Toward Goals  OT Goals(current goals can now be found in the care plan section)  Progress towards OT goals: Progressing toward goals  Acute Rehab OT Goals Patient Stated Goal: to return home soon OT Goal Formulation: With patient Time For Goal Achievement: 04/30/22 Potential to Achieve Goals: Good  Plan Discharge plan remains appropriate       AM-PAC OT "6 Clicks" Daily Activity     Outcome Measure   Help from another person eating meals?: None Help from another person taking care of personal grooming?: None Help from another person toileting, which includes using toliet, bedpan, or urinal?: A Little Help from another person bathing (including washing, rinsing, drying)?: A Lot Help from another person to put on and taking off regular upper body clothing?: None Help from another person to put on and taking off regular lower body clothing?: A Little 6 Click Score: 20    End of Session Equipment Utilized During Treatment: Oxygen  OT Visit Diagnosis: Unsteadiness on feet (R26.81);Muscle weakness (generalized) (M62.81)   Activity Tolerance Patient tolerated treatment well   Patient Left in bed;with call bell/phone within reach;with bed alarm set;with family/visitor present   Nurse Communication Mobility status        Time: 9562-1308 OT Time Calculation (min): 14 min  Charges: OT General Charges $OT Visit: 1 Visit OT  Treatments $Therapeutic Activity: 8-22 mins    Leota Sauers, OTR/L 04/23/2022, 5:33 PM

## 2022-04-23 NOTE — Progress Notes (Signed)
PROGRESS NOTE  Ellen Acevedo RCV:[id number] DOB: 1961/04/06   PCP: Nolene Ebbs, MD  Patient is from: Home.  Poor living condition.  DOA: 04/13/2022 LOS: 9  Chief complaints Chief Complaint  Patient presents with   Diarrhea   Failure To Thrive     Brief Narrative / Interim history: 61 year old F with PMH of schizophrenia, DM-2, morbid obesity, HTN, tobacco use disorder and HLD presenting with diarrhea, generalized abdominal pain, confusion, cough, shortness of breath, and admitted for acute respiratory failure with hypoxia and hypercapnia and acute on chronic diastolic CHF.  ABG with hypercapnia to 90 but normal pH.  BNP elevated to 970.  CTA chest negative for PE CAD and bilateral effusions with RUL and RLL airspace opacities.  CT abdomen with heterogeneous opacity throughout the liver and anasarca.  TTE with normal LVEF but calcified mitral valve.  GIP positive for Campylobacter.  Patient was treated with BiPAP and IV Lasix.  Symptoms improved.  Therapy recommended SNF. Medically optimized for discharge.   Subjective: Seen and examined earlier this morning.  No major events overnight of this morning.  Refused to wear BiPAP again due to claustrophobia.  No complaint other than some pain in his feet this morning.  Denies chest pain, shortness of breath, nausea or vomiting.  Objective: Vitals:   04/23/22 0747 04/23/22 0820 04/23/22 1100 04/23/22 1432  BP:  130/83  119/71  Pulse:  (!) 114  92  Resp: 17 17 19 17   Temp:  97.7 F (36.5 C)  99 F (37.2 C)  TempSrc:  Oral  Oral  SpO2: 92% 90% 95% 93%  Weight:      Height:        Examination:  GENERAL: No apparent distress.  Nontoxic. HEENT: MMM.  Vision and hearing grossly intact.  NECK: Supple.  No apparent JVD.  RESP:  No IWOB.  Fair aeration bilaterally. CVS:  RRR. Heart sounds normal.  ABD/GI/GU: BS+. Abd soft, NTND.  MSK/EXT:  Moves extremities. No apparent deformity. No edema.  SKIN: no apparent skin lesion or  wound NEURO: Awake and alert.  Fairly oriented.  No apparent focal neuro deficit. PSYCH: Calm. Normal affect.   Procedures:  None  Microbiology summarized: [id number] and influenza PCR nonreactive. Urine culture and blood culture negative. GIP with Campylobacter species  Assessment and plan: Principal Problem:   Acute on chronic respiratory failure with hypoxia and hypercapnia (HCC) Active Problems:   Acute on chronic diastolic CHF (congestive heart failure) (HCC)   Infectious diarrhea   CAD (coronary artery disease)   Schizophrenia (HCC)   Controlled type 2 diabetes mellitus without complication, without long-term current use of insulin (HCC)   HTN (hypertension)   HLD (hyperlipidemia)   Obesity, Class III, BMI 40-49.9 (morbid obesity) ([place])   At risk for sleep apnea   Difficulty with activities of daily living   Unsatisfactory living conditions  Acute on chronic hypoxic and hypercarbic respiratory failure: 84% on RA.  Not on oxygen at home.  ABG with hypercapnia with normal pH suggesting chronic obstructive etiology such as OHS/OSA or undiagnosed COPD.  CTA chest with some RUL and RLL opacity but Pro-Cal negative.  -ADAPT will follow patient on the outpatient setting for triology/Bipap but I doubt she would wear. -Wean oxygen as able.  Minimum oxygen to keep saturation above 88%. -BiPAP as needed and at night but patient is noncompliant partly due to anxiety   Acute diastolic congestive heart failure: BNP elevated to 900s.  TTE with LVEF of 55  to 60%, mild cLVH, indeterminate DD, moderate pericardial effusion without tamponade and calcified mitral valve.  Diuresed with IV Lasix and transition to p.o. torsemide.  Net -36 L.  Renal function stable.  Some alkalosis on BMP. -Decrease torsemide to 40 mg daily -Continue Aldactone 25 mg daily -Strict intake and output, daily weight, renal functions and electrolytes  Acute metabolic encephalopathy: Seems to have resolved.  CT head  without acute finding.  No focal neurodeficit.  She has significant hypercapnia but compensated. -Reorientation and delirium precautions -Wean oxygen as above.  Minimum to keep saturation above 88% -Avoid or minimize sedating medications   Campylobacter gastroenteritis: Presented with diarrhea and generalized abdominal pain.  CT abdomen without evidence of colitis.  Diarrhea resolved.  Enteric precaution discontinued.    Coronary artery disease: Noted on CT chest.  Complained of chest pain this morning now has resolved.  Troponins normal.  EKG without any findings suggestive of ischemic changes   Fatty liver disease: Right upper quadrant ultrasound showed mildly increased echogenicity throughout the liver.   Uterine abnormality: CT abdomen/pelvis with 1.4 cm enhancing nodule centrally within the uterus could reflect a submucosal fibroid or endometrial polyp.  Pelvic US limited due to morbid obesity but with endometrial thickness considered abnormal for asymptomatic postmenopausal result patient with recommendation of endometrial sampling to exclude carcinoma.   -Outpatient follow-up with GYN.    Controlled NIDDM-2: A1c of 6.5.  Not on medication at home.  CBG within acceptable range. -Discontinue CBG monitoring and SSI   Essential hypertension: Normotensive.  Not on meds at home. -Diuretics as above.   History of schizophrenia: Does not seem to be on antipsychotics.  Depakote and Effexor on home list. -Continue home medication  Hypokalemia: K 3.4 -P.o. KCl 40x 2   Debility/deconditioning/poor living situation: found to have infestation with bedbugs, roaches at home.  Unable to take care of herself at baseline.  Family seeking guardianship.  Very poor living condition.  PT/OT recommending SNF on discharge.  TOC following  Morbid obesity Body mass index is 43.48 kg/m.           DVT prophylaxis:  enoxaparin (LOVENOX) injection 40 mg Start: 04/14/22 2200  Code Status: Full  code Family Communication: None at bedside. Level of care: Progressive Status is: Inpatient Remains inpatient appropriate because: Acute on chronic diastolic CHF and safe disposition.   Final disposition: SNF Consultants:  None  Sch Meds:  Scheduled Meds:  divalproex  500 mg Oral QHS   enoxaparin (LOVENOX) injection  40 mg Subcutaneous Q24H   liver oil-zinc oxide   Topical BID   loratadine  10 mg Oral QHS   metoprolol tartrate  12.5 mg Oral BID   nicotine  21 mg Transdermal Daily   spironolactone  25 mg Oral Daily   [START ON 04/24/2022] torsemide  40 mg Oral Daily   venlafaxine XR  75 mg Oral Q breakfast   Continuous Infusions: PRN Meds:.acetaminophen **OR** acetaminophen, albuterol, mouth rinse  Antimicrobials: Anti-infectives (From admission, onward)    Start     Dose/Rate Route Frequency Ordered Stop   04/13/22 1745  ceFEPIme (MAXIPIME) 2 g in sodium chloride 0.9 % 100 mL IVPB        2 g 200 mL/hr over 30 Minutes Intravenous  Once 04/13/22 1733 04/13/22 2219   04/13/22 1745  metroNIDAZOLE (FLAGYL) IVPB 500 mg        500 mg 100 mL/hr over 60 Minutes Intravenous  Once 04/13/22 1733 04/14/22 0652   04/13/22 1745  vancomycin (VANCOCIN) IVPB 1000 mg/200 mL premix        1,000 mg 200 mL/hr over 60 Minutes Intravenous  Once 04/13/22 1733 04/14/22 0653        I have personally reviewed the following labs and images: CBC: Recent Labs  Lab 04/17/22 0417 04/18/22 0410 04/22/22 0402 04/23/22 0414  WBC 6.2 4.5 5.1 5.7  HGB 11.7* 12.5 12.7 12.8  HCT 42.9 46.1* 47.2* 47.4*  MCV 85.8 86.2 86.3 86.2  PLT 160 147* 165 172   BMP &GFR Recent Labs  Lab 04/17/22 0417 04/18/22 0410 04/19/22 0405 04/20/22 0449 04/21/22 0426 04/22/22 0402 04/23/22 0414  NA 139 140 140 141 139 138 137  K 3.6 3.4* 3.1* 3.3* 3.5 3.5 3.4*  CL 90* 88* 89* 88* 88* 84* 83*  CO2 44* >45* 44* >45* 44* 44* >45*  GLUCOSE 72 75 83 83 80 85 97  BUN 11 12 14 14 15 14 16   CREATININE 0.46 0.48  0.49 0.37* 0.59 0.48 0.64  CALCIUM 8.5* 8.8* 8.6* 8.9 9.0 8.9 9.1  MG 1.9 1.8  --   --   --  1.8 1.8  PHOS  --   --   --   --   --  3.6 3.5   Estimated Creatinine Clearance: 91.9 mL/min (by C-G formula based on SCr of 0.64 mg/dL). Liver & Pancreas: Recent Labs  Lab 04/22/22 0402 04/23/22 0414  AST 18  --   ALT 14  --   ALKPHOS 72  --   BILITOT 0.7  --   PROT 6.6  --   ALBUMIN 2.8* 2.8*   No results for input(s): "LIPASE", "AMYLASE" in the last 168 hours. No results for input(s): "AMMONIA" in the last 168 hours. Diabetic: No results for input(s): "HGBA1C" in the last 72 hours. Recent Labs  Lab 04/16/22 1944 04/17/22 0836 04/17/22 1230 04/17/22 1650 04/17/22 2043  GLUCAP 141* 76 95 97 120*   Cardiac Enzymes: No results for input(s): "CKTOTAL", "CKMB", "CKMBINDEX", "TROPONINI" in the last 168 hours. No results for input(s): "PROBNP" in the last 8760 hours. Coagulation Profile: No results for input(s): "INR", "PROTIME" in the last 168 hours. Thyroid Function Tests: No results for input(s): "TSH", "T4TOTAL", "FREET4", "T3FREE", "THYROIDAB" in the last 72 hours.  Lipid Profile: No results for input(s): "CHOL", "HDL", "LDLCALC", "TRIG", "CHOLHDL", "LDLDIRECT" in the last 72 hours. Anemia Panel: No results for input(s): "VITAMINB12", "FOLATE", "FERRITIN", "TIBC", "IRON", "RETICCTPCT" in the last 72 hours. Urine analysis:    Component Value Date/Time   COLORURINE YELLOW 04/14/2022 0018   APPEARANCEUR CLEAR 04/14/2022 0018   LABSPEC 1.005 04/14/2022 0018   PHURINE 6.0 04/14/2022 0018   GLUCOSEU NEGATIVE 04/14/2022 0018   HGBUR MODERATE (A) 04/14/2022 0018   BILIRUBINUR NEGATIVE 04/14/2022 0018   KETONESUR NEGATIVE 04/14/2022 0018   PROTEINUR NEGATIVE 04/14/2022 0018   UROBILINOGEN 1.0 11/30/2008 1156   NITRITE NEGATIVE 04/14/2022 0018   LEUKOCYTESUR MODERATE (A) 04/14/2022 0018   Sepsis Labs: Invalid input(s): "PROCALCITONIN", "LACTICIDVEN"  Microbiology: Recent  Results (from the past 240 hour(s))  Culture, blood (Routine X 2) w Reflex to ID Panel     Status: None   Collection Time: 04/13/22  6:25 PM   Specimen: Right Antecubital; Blood  Result Value Ref Range Status   Specimen Description   Final    RIGHT ANTECUBITAL BLOOD Performed at [facility], [address] [place], [place]    Special Requests   Final    BOTTLES DRAWN  AEROBIC AND ANAEROBIC Blood Culture adequate volume Performed at [facility], [address] [place], [place] [address]    Culture   Final    NO GROWTH 5 DAYS Performed at [facility], [address] [place], [place] [address]    Report Status 04/18/2022 FINAL  Final  Culture, blood (Routine X 2) w Reflex to ID Panel     Status: None   Collection Time: 04/13/22  8:45 PM   Specimen: Left Antecubital; Blood  Result Value Ref Range Status   Specimen Description   Final    LEFT ANTECUBITAL BLOOD Performed at [facility], [address] [place], [place] [address]    Special Requests   Final    BOTTLES DRAWN AEROBIC AND ANAEROBIC Blood Culture results may not be optimal due to an excessive volume of blood received in culture bottles Performed at [facility], [address] [place], [place] [address]    Culture   Final    NO GROWTH 5 DAYS Performed at [facility], [address] [place], [place] [address]    Report Status 04/19/2022 FINAL  Final  Resp Panel by RT-PCR (Flu A&B, Covid) Anterior Nasal Swab     Status: None   Collection Time: 04/13/22  8:50 PM   Specimen: Anterior Nasal Swab  Result Value Ref Range Status   SARS Coronavirus 2 by RT PCR NEGATIVE NEGATIVE Final    Comment: (NOTE) SARS-CoV-2 target nucleic acids are NOT DETECTED.  The SARS-CoV-2 RNA is generally detectable in upper respiratory specimens during the acute phase of infection. The lowest concentration of SARS-CoV-2 viral copies this assay can detect is 138  copies/mL. A negative result does not preclude SARS-Cov-2 infection and should not be used as the sole basis for treatment or other patient management decisions. A negative result may occur with  improper specimen collection/handling, submission of specimen other than nasopharyngeal swab, presence of viral mutation(s) within the areas targeted by this assay, and inadequate number of viral copies(<138 copies/mL). A negative result must be combined with clinical observations, patient history, and epidemiological information. The expected result is Negative.  Fact Sheet for Patients:  BloggerCourse.comhttps://www.fda.gov/media/152166/download  Fact Sheet for Healthcare Providers:  SeriousBroker.ithttps://www.fda.gov/media/152162/download  This test is no t yet approved or cleared by the [place] FDA and  has been authorized for detection and/or diagnosis of SARS-CoV-2 by FDA under an Emergency Use Authorization (EUA). This EUA will remain  in effect (meaning this test can be used) for the duration of the COVID-19 declaration under Section 564(b)(1) of the Act, 21 U.S.C.section 360bbb-3(b)(1), unless the authorization is terminated  or revoked sooner.       Influenza A by PCR NEGATIVE NEGATIVE Final   Influenza B by PCR NEGATIVE NEGATIVE Final    Comment: (NOTE) The Xpert Xpress SARS-CoV-2/FLU/RSV plus assay is intended as an aid in the diagnosis of influenza from Nasopharyngeal swab specimens and should not be used as a sole basis for treatment. Nasal washings and aspirates are unacceptable for Xpert Xpress SARS-CoV-2/FLU/RSV testing.  Fact Sheet for Patients: BloggerCourse.comhttps://www.fda.gov/media/152166/download  Fact Sheet for Healthcare Providers: SeriousBroker.ithttps://www.fda.gov/media/152162/download  This test is not yet approved or cleared by the [place] FDA and has been authorized for detection and/or diagnosis of SARS-CoV-2 by FDA under an Emergency Use Authorization (EUA). This EUA will remain in effect (meaning  this test can be used) for the duration of the COVID-19 declaration under Section 564(b)(1) of the Act, 21  U.S.C. section 360bbb-3(b)(1), unless the authorization is terminated or revoked.  Performed at [facility], [address], [place], [place] [address]   Urine Culture     Status: None   Collection Time: 04/14/22 12:18 AM   Specimen: Urine, Catheterized  Result Value Ref Range Status   Specimen Description   Final    URINE, CATHETERIZED Performed at [facility], [address], [place], [place] [address]    Special Requests   Final    NONE Performed at [facility], [address], [place], [place] [address]    Culture   Final    NO GROWTH Performed at [facility], [address], [place], [place] [address]    Report Status 04/15/2022 FINAL  Final  Gastrointestinal Panel by PCR , Stool     Status: Abnormal   Collection Time: 04/14/22  4:11 PM   Specimen: Stool  Result Value Ref Range Status   Campylobacter species DETECTED (A) NOT DETECTED Final    Comment: RESULT CALLED TO, READ BACK BY AND VERIFIED WITH: Phillis Knack RN 1424 04/15/22 HNM    Plesimonas shigelloides NOT DETECTED NOT DETECTED Final   Salmonella species NOT DETECTED NOT DETECTED Final   Yersinia enterocolitica NOT DETECTED NOT DETECTED Final   Vibrio species NOT DETECTED NOT DETECTED Final   Vibrio cholerae NOT DETECTED NOT DETECTED Final   Enteroaggregative E coli (EAEC) NOT DETECTED NOT DETECTED Final   Enteropathogenic E coli (EPEC) NOT DETECTED NOT DETECTED Final   Enterotoxigenic E coli (ETEC) NOT DETECTED NOT DETECTED Final   Shiga like toxin producing E coli (STEC) NOT DETECTED NOT DETECTED Final   Shigella/Enteroinvasive E coli (EIEC) NOT DETECTED NOT DETECTED Final   Cryptosporidium NOT DETECTED NOT DETECTED Final   Cyclospora cayetanensis NOT DETECTED NOT DETECTED Final   Entamoeba histolytica NOT DETECTED NOT DETECTED Final    Giardia lamblia NOT DETECTED NOT DETECTED Final   Adenovirus F40/41 NOT DETECTED NOT DETECTED Final   Astrovirus NOT DETECTED NOT DETECTED Final   Norovirus GI/GII NOT DETECTED NOT DETECTED Final   Rotavirus A NOT DETECTED NOT DETECTED Final   Sapovirus (I, II, IV, and V) NOT DETECTED NOT DETECTED Final    Comment: Performed at [facility], [address] [place], [place] [address]    Radiology Studies: No results found.    Amiria Orrison T. Delena Casebeer [facility]  If 7PM-7AM, please contact night-coverage [email or web address] 04/23/2022, 4:56 PM

## 2022-04-23 NOTE — Progress Notes (Signed)
Physical Therapy Treatment Patient Details Name: Ellen Acevedo MRN: [id number] DOB: 1960-08-21 Today's Date: 04/23/2022   History of Present Illness Ellen Acevedo is a 61 y.o. female with class III obesity and no documented past medical history presented to the ED via EMS from home for evaluation of diarrhea, confusion, cough, and shortness of breath.  Police Department was called for wellness check and patient found at home with bedbugs and roaches.  SPO2 77% on room air with EMS, improved with 4 L O2.  Vital signs on arrival to the ED: Temperature 98.1 F, heart rate 106, respiratory rate 23, blood pressure 159/101, SPO2 100% on 4 L O2.  Labs showing no leukocytosis, lactic acid 2.2 > 0.7, blood cultures drawn, troponin 19> 18, BNP 973, COVID and influenza PCR negative.  UA with negative nitrite, moderate leukocytes, and microscopy showing 0-5 WBCs and no bacteria.  Urine culture pending.  VBG showing pH 7.35, PCO2 80.  She was placed on BiPAP.  CTA chest negative for PE.  Showing aortic atherosclerosis, coronary artery disease, mild cardiomegaly, bilateral pleural effusions, and right upper and lower lobe airspace opacities suspicious for pneumonia versus atelectasis.  CT abdomen pelvis showing heterogeneous enhancement throughout the liver, 1.4 cm nodule within the uterus which could reflect a submucosal fibroid or endometrial polyp.  Pelvic ultrasound recommended for further evaluation.  CT also showing diffuse anasarca throughout the abdominal wall and aortic atherosclerosis.  CT head negative for acute finding. Patient was given vancomycin, cefepime, metronidazole, and 1 L normal saline bolus.     History limited as patient is currently on BiPAP.  He reports diarrhea for the past 3 weeks and mild generalized abdominal pain.  Reports shortness of breath and bilateral lower extremity edema for several months.  Denies chest pain.  She reports medical history of schizophrenia, diabetes, hypertension,  hyperlipidemia, and "heart problems."    PT Comments    Patient  eager to get to BR, Able to ambulate in room then x 55' in hall on 2 L O2. Spo2 95% reting, 88 % after ambulation.continue PT for  progressive  mobility.    Recommendations for follow up therapy are one component of a multi-disciplinary discharge planning process, led by the attending physician.  Recommendations may be updated based on patient status, additional functional criteria and insurance authorization.  Follow Up Recommendations  Skilled nursing-short term rehab (<3 hours/day) Can patient physically be transported by private vehicle: Yes   Assistance Recommended at Discharge Intermittent Supervision/Assistance  Patient can return home with the following A little help with walking and/or transfers;A little help with bathing/dressing/bathroom;Assistance with cooking/housework;Direct supervision/assist for medications management;Assist for transportation;Help with stairs or ramp for entrance   Equipment Recommendations  None recommended by PT    Recommendations for Other Services       Precautions / Restrictions Precautions Precaution Comments: monitor O2     Mobility  Bed Mobility               General bed mobility comments: in recliner    Transfers Overall transfer level: Needs assistance Equipment used: Rolling walker (2 wheels) Transfers: Sit to/from Stand Sit to Stand: Supervision, From elevated surface           General transfer comment: VCs hand placement    Ambulation/Gait Ambulation/Gait assistance: Min guard, +2 safety/equipment Gait Distance (Feet): 50 Feet Assistive device: Rolling walker (2 wheels) Gait Pattern/deviations: Step-through pattern, Decreased stride length Gait velocity: decr     General Gait Details: Spo2  88% after ambulation, HR99   Stairs             Wheelchair Mobility    Modified [place] (Stroke Patients Only)       Balance    Sitting-balance support: Feet supported Sitting balance-Leahy Scale: Fair     Standing balance support: Bilateral upper extremity supported, During functional activity, Single extremity supported Standing balance-Leahy Scale: Fair Standing balance comment: able to stand and perform pericare with 1 UE support                            Cognition Arousal/Alertness: Awake/alert Behavior During Therapy: WFL for tasks assessed/performed                                            Exercises      General Comments        Pertinent Vitals/Pain Pain Assessment Faces Pain Scale: Hurts a little bit Breathing: normal Negative Vocalization: occasional moan/groan, low speech, negative/disapproving quality Body Language: relaxed Consolability: no need to console Facial Expression: Relaxed, neutral Body Movements: Absence of movements Muscle Tension: Relaxed Compliance with ventilator (intubated pts.): N/A Pain Location: B LE Pain Descriptors / Indicators: Tightness, Discomfort Pain Intervention(s): Monitored during session, Repositioned    Home Living                          Prior Function            PT Goals (current goals can now be found in the care plan section) Progress towards PT goals: Progressing toward goals    Frequency    Min 2X/week      PT Plan Current plan remains appropriate    Co-evaluation              AM-PAC PT "6 Clicks" Mobility   Outcome Measure  Help needed turning from your back to your side while in a flat bed without using bedrails?: A Little Help needed moving from lying on your back to sitting on the side of a flat bed without using bedrails?: A Little Help needed moving to and from a bed to a chair (including a wheelchair)?: A Little Help needed standing up from a chair using your arms (e.g., wheelchair or bedside chair)?: A Little Help needed to walk in hospital room?: A Little Help needed  climbing 3-5 steps with a railing? : A Lot 6 Click Score: 17    End of Session Equipment Utilized During Treatment: Gait belt;Oxygen Activity Tolerance: Patient tolerated treatment well Patient left: with call bell/phone within reach;in chair;with family/visitor present Nurse Communication: Mobility status PT Visit Diagnosis: Unsteadiness on feet (R26.81);Muscle weakness (generalized) (M62.81);Difficulty in walking, not elsewhere classified (R26.2)     Time: [id number] PT Time Calculation (min) (ACUTE ONLY): 22 min  Charges:  $Gait Training: 8-22 mins                     [place] Office [phone] Weekend pager-[phone]    Claretha Cooper 04/23/2022, 4:58 PM

## 2022-04-24 DIAGNOSIS — I5031 Acute diastolic (congestive) heart failure: Secondary | ICD-10-CM | POA: Diagnosis not present

## 2022-04-24 DIAGNOSIS — I471 Supraventricular tachycardia, unspecified: Secondary | ICD-10-CM

## 2022-04-24 DIAGNOSIS — I10 Essential (primary) hypertension: Secondary | ICD-10-CM | POA: Diagnosis not present

## 2022-04-24 DIAGNOSIS — J9621 Acute and chronic respiratory failure with hypoxia: Secondary | ICD-10-CM | POA: Diagnosis not present

## 2022-04-24 DIAGNOSIS — F209 Schizophrenia, unspecified: Secondary | ICD-10-CM | POA: Diagnosis not present

## 2022-04-24 DIAGNOSIS — R000 Tachycardia, unspecified: Secondary | ICD-10-CM

## 2022-04-24 LAB — CBC
HCT: 48.3 % — ABNORMAL HIGH (ref 36.0–46.0)
Hemoglobin: 13.3 g/dL (ref 12.0–15.0)
MCH: 23.6 pg — ABNORMAL LOW (ref 26.0–34.0)
MCHC: 27.5 g/dL — ABNORMAL LOW (ref 30.0–36.0)
MCV: 85.6 fL (ref 80.0–100.0)
Platelets: 203 10*3/uL (ref 150–400)
RBC: 5.64 MIL/uL — ABNORMAL HIGH (ref 3.87–5.11)
RDW: 21.2 % — ABNORMAL HIGH (ref 11.5–15.5)
WBC: 6.8 10*3/uL (ref 4.0–10.5)
nRBC: 0 % (ref 0.0–0.2)

## 2022-04-24 LAB — RENAL FUNCTION PANEL
Albumin: 3 g/dL — ABNORMAL LOW (ref 3.5–5.0)
Anion gap: 12 (ref 5–15)
BUN: 15 mg/dL (ref 8–23)
CO2: 40 mmol/L — ABNORMAL HIGH (ref 22–32)
Calcium: 9.5 mg/dL (ref 8.9–10.3)
Chloride: 85 mmol/L — ABNORMAL LOW (ref 98–111)
Creatinine, Ser: 0.49 mg/dL (ref 0.44–1.00)
GFR, Estimated: >60 mL/min (ref >60)
Glucose, Bld: 111 mg/dL — ABNORMAL HIGH (ref 70–99)
Phosphorus: 3.8 mg/dL (ref 2.5–4.6)
Potassium: 3.6 mmol/L (ref 3.5–5.1)
Sodium: 137 mmol/L (ref 135–145)

## 2022-04-24 LAB — MAGNESIUM: Magnesium: 1.9 mg/dL (ref 1.7–2.4)

## 2022-04-24 MED ORDER — POTASSIUM CHLORIDE CRYS ER 20 MEQ PO TBCR
40.0000 meq | EXTENDED_RELEASE_TABLET | Freq: Once | ORAL | Status: AC
  Administered 2022-04-24: 40 meq via ORAL
  Filled 2022-04-24: qty 2

## 2022-04-24 NOTE — Progress Notes (Signed)
PROGRESS NOTE  Ellen Acevedo JKD:[id number] DOB: January 22, 1961   PCP: Fleet Contras, MD  Patient is from: Home.  Poor living condition.  DOA: 04/13/2022 LOS: 10  Chief complaints Chief Complaint  Patient presents with   Diarrhea   Failure To Thrive     Brief Narrative / Interim history: 61 year old F with PMH of schizophrenia, DM-2, morbid obesity, HTN, tobacco use disorder and HLD presenting with diarrhea, generalized abdominal pain, confusion, cough, shortness of breath, and admitted for acute respiratory failure with hypoxia and hypercapnia and acute on chronic diastolic CHF.  ABG with hypercapnia to 90 but normal pH.  BNP elevated to 970.  CTA chest negative for PE CAD and bilateral effusions with RUL and RLL airspace opacities.  CT abdomen with heterogeneous opacity throughout the liver and anasarca.  TTE with normal LVEF but calcified mitral valve.  GIP positive for Campylobacter.  Patient was treated with BiPAP and IV Lasix.  Symptoms improved.  Therapy recommended SNF. Medically optimized for discharge.   Subjective: Seen and examined earlier this morning.  No major events overnight of this morning.  No complaints.  Denies shortness of breath, chest pain, GI or UTI symptoms.  Has been refusing to wear BiPAP.  Objective: Vitals:   04/23/22 1432 04/23/22 2341 04/24/22 0522 04/24/22 1439  BP: 119/71 131/87 125/81 118/77  Pulse: 92 (!) 105 (!) 105 96  Resp: 17 18 18 14   Temp: 99 F (37.2 C) 97.7 F (36.5 C) 97.6 F (36.4 C) 97.8 F (36.6 C)  TempSrc: Oral Oral Oral Oral  SpO2: 93% 98% 96% 94%  Weight:   110.4 kg   Height:        Examination:  GENERAL: No apparent distress.  Nontoxic. HEENT: MMM.  Vision and hearing grossly intact.  NECK: Supple.  No apparent JVD.  RESP:  No IWOB.  Fair aeration bilaterally. CVS:  RRR. Heart sounds normal.  ABD/GI/GU: BS+. Abd soft, NTND.  MSK/EXT:  Moves extremities. No apparent deformity. No edema.  SKIN: no apparent skin  lesion or wound NEURO: Awake and alert.  Fairly oriented.  No apparent focal neuro deficit. PSYCH: Calm. Normal affect.   Procedures:  None  Microbiology summarized: COVID-19 and influenza PCR nonreactive. Urine culture and blood culture negative. GIP with Campylobacter species  Assessment and plan: Principal Problem:   Acute on chronic respiratory failure with hypoxia and hypercapnia (HCC) Active Problems:   Acute on chronic diastolic CHF (congestive heart failure) (HCC)   Infectious diarrhea   CAD (coronary artery disease)   Schizophrenia (HCC)   Controlled type 2 diabetes mellitus without complication, without long-term current use of insulin (HCC)   HTN (hypertension)   HLD (hyperlipidemia)   Obesity, Class III, BMI 40-49.9 (morbid obesity) (HCC)   At risk for sleep apnea   Difficulty with activities of daily living   Unsatisfactory living conditions   Paroxysmal SVT (supraventricular tachycardia)   Sinus tachycardia  Acute on chronic hypoxic and hypercarbic respiratory failure: 84% on RA.  Not on oxygen at home.  ABG with hypercapnia with normal pH suggesting chronic obstructive etiology such as OHS/OSA or undiagnosed COPD.  CTA chest with some RUL and RLL opacity but Pro-Cal negative.  -[facility] will follow patient on the outpatient setting for triology/Bipap but I doubt she would wear. -Wean oxygen as able.  Minimum oxygen to keep saturation above 88%. -BiPAP as needed and at night but patient is noncompliant partly due to anxiety   Acute diastolic congestive heart failure: BNP elevated  to 900s.  TTE with LVEF of 55 to 60%, mild cLVH, indeterminate DD, moderate pericardial effusion without tamponade and calcified mitral valve.  Diuresed with IV Lasix and transition to p.o. torsemide.  Net -37 L.  Renal function stable.  Some alkalosis but improving. -Continue torsemide 40 mg daily -Continue Aldactone 25 mg daily -Strict intake and output, daily weight, renal functions and  electrolytes  Acute metabolic encephalopathy: Seems to have resolved.  CT head without acute finding.  No focal neurodeficit.  She has significant hypercapnia but compensated. -Reorientation and delirium precautions -Wean oxygen as above.  Minimum to keep saturation above 88% -Avoid or minimize sedating medications   Campylobacter gastroenteritis: CT without evidence of colitis.  Resolved.   Coronary artery disease: Noted on CT chest.  Complained of chest pain this morning now has resolved.  Troponins normal.  EKG without any findings suggestive of ischemic changes  Sinus tachycardia/paroxysmal SVT: Likely due to sleep apnea.  Refusing to wear BiPAP due to anxiety. -Continue low-dose metoprolol   Fatty liver disease: Right upper quadrant ultrasound showed mildly increased echogenicity throughout the liver.   Uterine abnormality: CT abdomen/pelvis with 1.4 cm enhancing nodule centrally within the uterus could reflect a submucosal fibroid or endometrial polyp.  Pelvic US limited due to morbid obesity but with endometrial thickness considered abnormal for asymptomatic postmenopausal result patient with recommendation of endometrial sampling to exclude carcinoma.   -Outpatient follow-up with GYN.    Controlled NIDDM-2: A1c of 6.5.  Not on medication at home.  CBG within acceptable range. -Discontinued CBG monitoring and SSI   Essential hypertension: Normotensive.  Not on meds at home. -Diuretics as above.   History of schizophrenia: Does not seem to be on antipsychotics.  Depakote and Effexor on home list. -Continue home medication  Hypokalemia: K 3.6 -P.o. KCl 40x 1   Debility/deconditioning/poor living situation: found to have infestation with bedbugs, roaches at home.  Unable to take care of herself at baseline.  Family seeking guardianship.  Very poor living condition.  PT/OT recommending SNF on discharge.  TOC following  Morbid obesity Body mass index is 41.78 kg/m.            DVT prophylaxis:  enoxaparin (LOVENOX) injection 40 mg Start: 04/14/22 2200  Code Status: Full code Family Communication: None at bedside. Level of care: Progressive Status is: Inpatient Remains inpatient appropriate because: Acute on chronic diastolic CHF and safe disposition.   Final disposition: SNF Consultants:  None  Sch Meds:  Scheduled Meds:  divalproex  500 mg Oral QHS   enoxaparin (LOVENOX) injection  40 mg Subcutaneous Q24H   liver oil-zinc oxide   Topical BID   loratadine  10 mg Oral QHS   metoprolol tartrate  12.5 mg Oral BID   nicotine  21 mg Transdermal Daily   potassium chloride  40 mEq Oral Once   spironolactone  25 mg Oral Daily   torsemide  40 mg Oral Daily   venlafaxine XR  75 mg Oral Q breakfast   Continuous Infusions: PRN Meds:.acetaminophen **OR** acetaminophen, albuterol, mouth rinse  Antimicrobials: Anti-infectives (From admission, onward)    Start     Dose/Rate Route Frequency Ordered Stop   04/13/22 1745  ceFEPIme (MAXIPIME) 2 g in sodium chloride 0.9 % 100 mL IVPB        2 g 200 mL/hr over 30 Minutes Intravenous  Once 04/13/22 1733 04/13/22 2219   04/13/22 1745  metroNIDAZOLE (FLAGYL) IVPB 500 mg  500 mg 100 mL/hr over 60 Minutes Intravenous  Once 04/13/22 1733 04/14/22 0652   04/13/22 1745  vancomycin (VANCOCIN) IVPB 1000 mg/200 mL premix        1,000 mg 200 mL/hr over 60 Minutes Intravenous  Once 04/13/22 1733 04/14/22 0653        I have personally reviewed the following labs and images: CBC: Recent Labs  Lab 04/18/22 0410 04/22/22 0402 04/23/22 0414 04/24/22 0353  WBC 4.5 5.1 5.7 6.8  HGB 12.5 12.7 12.8 13.3  HCT 46.1* 47.2* 47.4* 48.3*  MCV 86.2 86.3 86.2 85.6  PLT 147* 165 172 203   BMP &GFR Recent Labs  Lab 04/18/22 0410 04/19/22 0405 04/20/22 0449 04/21/22 0426 04/22/22 0402 04/23/22 0414 04/24/22 0353  NA 140   < > 141 139 138 137 137  K 3.4*   < > 3.3* 3.5 3.5 3.4* 3.6  CL 88*   < > 88* 88* 84* 83*  85*  CO2 >45*   < > >45* 44* 44* >45* 40*  GLUCOSE 75   < > 83 80 85 97 111*  BUN 12   < > 14 15 14 16 15   CREATININE 0.48   < > 0.37* 0.59 0.48 0.64 0.49  CALCIUM 8.8*   < > 8.9 9.0 8.9 9.1 9.5  MG 1.8  --   --   --  1.8 1.8 1.9  PHOS  --   --   --   --  3.6 3.5 3.8   < > = values in this interval not displayed.   Estimated Creatinine Clearance: 89.8 mL/min (by C-G formula based on SCr of 0.49 mg/dL). Liver & Pancreas: Recent Labs  Lab 04/22/22 0402 04/23/22 0414 04/24/22 0353  AST 18  --   --   ALT 14  --   --   ALKPHOS 72  --   --   BILITOT 0.7  --   --   PROT 6.6  --   --   ALBUMIN 2.8* 2.8* 3.0*   No results for input(s): "LIPASE", "AMYLASE" in the last 168 hours. No results for input(s): "AMMONIA" in the last 168 hours. Diabetic: No results for input(s): "HGBA1C" in the last 72 hours. Recent Labs  Lab 04/17/22 1650 04/17/22 2043  GLUCAP 97 120*   Cardiac Enzymes: No results for input(s): "CKTOTAL", "CKMB", "CKMBINDEX", "TROPONINI" in the last 168 hours. No results for input(s): "PROBNP" in the last 8760 hours. Coagulation Profile: No results for input(s): "INR", "PROTIME" in the last 168 hours. Thyroid Function Tests: No results for input(s): "TSH", "T4TOTAL", "FREET4", "T3FREE", "THYROIDAB" in the last 72 hours.  Lipid Profile: No results for input(s): "CHOL", "HDL", "LDLCALC", "TRIG", "CHOLHDL", "LDLDIRECT" in the last 72 hours. Anemia Panel: No results for input(s): "VITAMINB12", "FOLATE", "FERRITIN", "TIBC", "IRON", "RETICCTPCT" in the last 72 hours. Urine analysis:    Component Value Date/Time   COLORURINE YELLOW 04/14/2022 0018   APPEARANCEUR CLEAR 04/14/2022 0018   LABSPEC 1.005 04/14/2022 0018   PHURINE 6.0 04/14/2022 0018   GLUCOSEU NEGATIVE 04/14/2022 0018   HGBUR MODERATE (A) 04/14/2022 0018   BILIRUBINUR NEGATIVE 04/14/2022 0018   KETONESUR NEGATIVE 04/14/2022 0018   PROTEINUR NEGATIVE 04/14/2022 0018   UROBILINOGEN 1.0 11/30/2008 1156    NITRITE NEGATIVE 04/14/2022 0018   LEUKOCYTESUR MODERATE (A) 04/14/2022 0018   Sepsis Labs: Invalid input(s): "PROCALCITONIN", "LACTICIDVEN"  Microbiology: Recent Results (from the past 240 hour(s))  Gastrointestinal Panel by PCR , Stool     Status: Abnormal  Collection Time: 04/14/22  4:11 PM   Specimen: Stool  Result Value Ref Range Status   Campylobacter species DETECTED (A) NOT DETECTED Final    Comment: RESULT CALLED TO, READ BACK BY AND VERIFIED WITH: Willene Hatchet RN 1424 04/15/22 HNM    Plesimonas shigelloides NOT DETECTED NOT DETECTED Final   Salmonella species NOT DETECTED NOT DETECTED Final   Yersinia enterocolitica NOT DETECTED NOT DETECTED Final   Vibrio species NOT DETECTED NOT DETECTED Final   Vibrio cholerae NOT DETECTED NOT DETECTED Final   Enteroaggregative E coli (EAEC) NOT DETECTED NOT DETECTED Final   Enteropathogenic E coli (EPEC) NOT DETECTED NOT DETECTED Final   Enterotoxigenic E coli (ETEC) NOT DETECTED NOT DETECTED Final   Shiga like toxin producing E coli (STEC) NOT DETECTED NOT DETECTED Final   Shigella/Enteroinvasive E coli (EIEC) NOT DETECTED NOT DETECTED Final   Cryptosporidium NOT DETECTED NOT DETECTED Final   Cyclospora cayetanensis NOT DETECTED NOT DETECTED Final   Entamoeba histolytica NOT DETECTED NOT DETECTED Final   Giardia lamblia NOT DETECTED NOT DETECTED Final   Adenovirus F40/41 NOT DETECTED NOT DETECTED Final   Astrovirus NOT DETECTED NOT DETECTED Final   Norovirus GI/GII NOT DETECTED NOT DETECTED Final   Rotavirus A NOT DETECTED NOT DETECTED Final   Sapovirus (I, II, IV, and V) NOT DETECTED NOT DETECTED Final    Comment: Performed at [facility], [address], [place], [place] [address]    Radiology Studies: No results found.    Emaad Nanna T. [place]  If 7PM-7AM, please contact night-coverage [email or web address] 04/24/2022, 3:14 PM

## 2022-04-24 NOTE — NC FL2 (Signed)
[place] MEDICAID FL2 LEVEL OF CARE SCREENING TOOL     IDENTIFICATION  Patient Name: Ellen Acevedo Birthdate: [birth year] Sex: female Admission Date (Current Location): 04/13/2022  [facility] and [place] Number:  Producer, television/film/video and Address:  [facility],  [address] [place], [place] [address]      Provider Number: [id number]  Attending Physician Name and Address:  Almon Hercules, MD  Relative Name and Phone Number:  Reganne Messerschmidt (sister) [phone]    Current Level of Care: Hospital Recommended Level of Care: Skilled Nursing Facility Prior Approval Number:    Date Approved/Denied:   PASRR Number: [id number]  Discharge Plan: SNF    Current Diagnoses: Patient Active Problem List   Diagnosis Date Noted   Obesity, Class III, BMI 40-49.9 (morbid obesity) (HCC) 04/21/2022   At risk for sleep apnea 04/21/2022   Difficulty with activities of daily living 04/21/2022   Unsatisfactory living conditions 04/21/2022   Acute on chronic diastolic CHF (congestive heart failure) (HCC) 04/14/2022   Infectious diarrhea 04/14/2022   CAD (coronary artery disease) 04/14/2022   Schizophrenia (HCC) 04/14/2022   Controlled type 2 diabetes mellitus without complication, without long-term current use of insulin (HCC) 04/14/2022   HTN (hypertension) 04/14/2022   HLD (hyperlipidemia) 04/14/2022   Acute on chronic respiratory failure with hypoxia and hypercapnia (HCC) 04/14/2022    Orientation RESPIRATION BLADDER Height & Weight     Self, Time, Situation  O2 (3L nasal cannuala) Continent Weight: 110.4 kg Height:  5\' 4"  (162.6 cm)  BEHAVIORAL SYMPTOMS/MOOD NEUROLOGICAL BOWEL NUTRITION STATUS      Continent Diet  AMBULATORY STATUS COMMUNICATION OF NEEDS Skin   Limited Assist (min guard) Verbally Normal                       Personal Care Assistance Level of Assistance  Bathing, Feeding, Dressing Bathing Assistance: Limited assistance Feeding assistance: Limited  assistance Dressing Assistance: Limited assistance     Functional Limitations Info  Sight, Hearing, Speech Sight Info: Adequate Hearing Info: Adequate Speech Info: Adequate    SPECIAL CARE FACTORS FREQUENCY  PT (By licensed PT), OT (By licensed OT)     PT Frequency: 5x per week OT Frequency: 5x per week            Contractures Contractures Info: Not present    Additional Factors Info  Code Status, Allergies Code Status Info: Full Allergies Info: Trilafon (Perphenazine)           Current Medications (04/24/2022):  This is the current hospital active medication list Current Facility-Administered Medications  Medication Dose Route Frequency Provider Last Rate Last Admin   acetaminophen (TYLENOL) tablet 650 mg  650 mg Oral Q6H PRN 04/26/2022, MD   650 mg at 04/20/22 1755   Or   acetaminophen (TYLENOL) suppository 650 mg  650 mg Rectal Q6H PRN 04/22/22, MD       albuterol (PROVENTIL) (2.5 MG/3ML) 0.083% nebulizer solution 2.5 mg  2.5 mg Nebulization Q6H PRN John Giovanni, Eric J, DO       divalproex (DEPAKOTE) DR tablet 500 mg  500 mg Oral QHS 03-09-1976, [place], DO   500 mg at 04/23/22 2332   enoxaparin (LOVENOX) injection 40 mg  40 mg Subcutaneous Q24H 04/25/22, MD   40 mg at 04/23/22 2332   liver oil-zinc oxide (DESITIN) 40 % ointment   Topical BID 04/25/22, Eric J, DO   Given at 04/24/22 0903   loratadine (CLARITIN) tablet  10 mg  10 mg Oral QHS [place] Donnamarie Poag, DO   10 mg at 04/23/22 2332   metoprolol tartrate (LOPRESSOR) tablet 12.5 mg  12.5 mg Oral BID Wendee Beavers T, MD   12.5 mg at 04/24/22 7371   nicotine (NICODERM CQ - dosed in mg/24 hours) patch 21 mg  21 mg Transdermal Daily [place] Eric J, DO   21 mg at 04/24/22 0626   Oral care mouth rinse  15 mL Mouth Rinse PRN [place] Eric J, DO       spironolactone (ALDACTONE) tablet 25 mg  25 mg Oral Daily Wendee Beavers T, MD   25 mg at 04/24/22 0903   torsemide (DEMADEX) tablet 40 mg  40 mg Oral Daily  Wendee Beavers T, MD   40 mg at 04/24/22 0902   venlafaxine XR (EFFEXOR-XR) 24 hr capsule 75 mg  75 mg Oral Q breakfast [place] Eric J, DO   75 mg at 04/24/22 9485     Discharge Medications: Please see discharge summary for a list of discharge medications.  Relevant Imaging Results:  Relevant Lab Results:   Additional Information SSN: [id number], See medication list for psychotropic medications  Roseanne Kaufman, RN

## 2022-04-24 NOTE — Plan of Care (Signed)

## 2022-04-24 NOTE — TOC Progression Note (Addendum)
Transition of Care [facility]) - Progression Note    Patient Details  Name: TALAYLA DOYEL MRN: [id number] Date of Birth: 09/16/1960  Transition of Care [facility]) CM/SW [place], RN Phone Number: 04/24/2022, 11:48 AM  Clinical Narrative:   Cyndie Mull number: [phone] B. Notified Nikki with [facility] regarding bed availability, awaiting response. Notified MD, awaiting update on medical status.   TOC will continue to follow.    - 11:57 am Nikki with [facility] SNF is unable to accept patient due to no available beds. Will re fax patient out for short term SNF.   TOC will continue to follow.  - 12:28p Spoke with patient's sister [name] to advise the previous bed offer with [facility] is no longer available, PASRR has been received however awaiting bed offer. [name] asked about a LOG, this RNCM advised that has not been an option from the hospital at this time.  TOC will continue to follow.   Expected Discharge Plan: Skilled Nursing Facility Barriers to Discharge: Continued Medical Work up, Programmer, applications (PASRR), Inadequate or no insurance  Expected Discharge Plan and Services Expected Discharge Plan: [place] In-house Referral: NA Discharge Planning Services: CM Consult Post Acute Care Choice: NA Living arrangements for the past 2 months: Apartment                 DME Arranged: N/A DME Agency: NA       HH Arranged: NA HH Agency: NA         Social Determinants of Health (SDOH) Interventions    Readmission Risk Interventions     No data to display

## 2022-04-24 NOTE — Progress Notes (Signed)
Notified on call that patient had 10-12 beats of SVT with HR going up to the 150's but did not sustain.

## 2022-04-25 DIAGNOSIS — I5031 Acute diastolic (congestive) heart failure: Secondary | ICD-10-CM | POA: Diagnosis not present

## 2022-04-25 DIAGNOSIS — F209 Schizophrenia, unspecified: Secondary | ICD-10-CM | POA: Diagnosis not present

## 2022-04-25 DIAGNOSIS — J9621 Acute and chronic respiratory failure with hypoxia: Secondary | ICD-10-CM | POA: Diagnosis not present

## 2022-04-25 DIAGNOSIS — I10 Essential (primary) hypertension: Secondary | ICD-10-CM | POA: Diagnosis not present

## 2022-04-25 LAB — GLUCOSE, CAPILLARY
Glucose-Capillary: 139 mg/dL — ABNORMAL HIGH (ref 70–99)
Glucose-Capillary: 237 mg/dL — ABNORMAL HIGH (ref 70–99)

## 2022-04-25 NOTE — Progress Notes (Signed)
PT Cancellation Note  Patient Details Name: Ellen Acevedo MRN: [id number] DOB: 29-Mar-1961   Cancelled Treatment:    Reason Eval/Treat Not Completed: Patient declined, no reason specified, stated she had walked to [place], does not want to get up again. [place] Office [phone] Weekend [phone]    Claretha Cooper 04/25/2022, 4:17 PM

## 2022-04-25 NOTE — TOC Progression Note (Signed)
Transition of Care [facility]) - Progression Note    Patient Details  Name: Ellen Acevedo MRN: [id number] Date of Birth: 1961-03-08  Transition of Care [facility]) CM/SW [place], RN Phone Number: 04/25/2022, 10:42 AM  Clinical Narrative:   Spoke with Prescott Parma with [facility] to advise the status of the patient.   PASRR received, awaiting bed offers.  Notified TOC supervisor of challenging placement needs.   TOC will continue to follow.    Expected Discharge Plan: Skilled Nursing Facility Barriers to Discharge: Continued Medical Work up, Programmer, applications (PASRR), Inadequate or no insurance  Expected Discharge Plan and Services Expected Discharge Plan: [place] In-house Referral: NA Discharge Planning Services: CM Consult Post Acute Care Choice: NA Living arrangements for the past 2 months: Apartment                 DME Arranged: N/A DME Agency: NA       HH Arranged: NA HH Agency: NA         Social Determinants of Health (SDOH) Interventions    Readmission Risk Interventions     No data to display

## 2022-04-25 NOTE — TOC Progression Note (Addendum)
Transition of Care [facility]) - Progression Note    Patient Details  Name: Ellen Acevedo MRN: [id number] Date of Birth: 01-26-1961  Transition of Care [facility]) CM/SW [place], RN Phone Number: 04/25/2022, 2:10 PM  Clinical Narrative:  Received 2 short term SNF bed offers, left voicemail for patient's sister [name] for call back. Left list of bed offers ([email or web address] at bedside. Awaiting SNF choice. Spoke with patient who reports her sister [name] is thinking of getting her hospital bed. This RNCM has not received a call back from [facility] to discuss most recent bed offers. Awaiting SNF choice.  TOC will continue to follow.     Expected Discharge Plan: [place] Barriers to Discharge: Continued Medical Work up  Expected Discharge Plan and Services Expected Discharge Plan: [place] In-house Referral: NA Discharge Planning Services: CM Consult Post Acute Care Choice: NA Living arrangements for the past 2 months: Apartment                 DME Arranged: N/A DME Agency: NA       HH Arranged: NA HH Agency: NA         Social Determinants of Health (SDOH) Interventions    Readmission Risk Interventions     No data to display

## 2022-04-25 NOTE — Progress Notes (Signed)
PROGRESS NOTE  Ellen Acevedo:[id number] DOB: 27-Jan-1961   PCP: Fleet Contras, MD  Patient is from: Home.  Poor living condition.  DOA: 04/13/2022 LOS: 11  Chief complaints Chief Complaint  Patient presents with   Diarrhea   Failure To Thrive     Brief Narrative / Interim history: 62 year old F with PMH of schizophrenia, DM-2, morbid obesity, HTN, tobacco use disorder and HLD presenting with diarrhea, generalized abdominal pain, confusion, cough, shortness of breath, and admitted for acute respiratory failure with hypoxia and hypercapnia and acute on chronic diastolic CHF.  ABG with hypercapnia to 90 but normal pH.  BNP elevated to 970.  CTA chest negative for PE CAD and bilateral effusions with RUL and RLL airspace opacities.  CT abdomen with heterogeneous opacity throughout the liver and anasarca.  TTE with normal LVEF but calcified mitral valve.  GIP positive for Campylobacter.  Patient was treated with BiPAP and IV Lasix.  Symptoms improved.  Therapy recommended SNF. Medically optimized for discharge.   Subjective: Seen and examined earlier this morning.  No major events overnight of this morning.  No complaints.  Denies shortness of breath, chest pain, GI or UTI symptoms.  Someone from [employer] visited this morning and left a business card with patient.  Objective: Vitals:   04/25/22 0534 04/25/22 0758 04/25/22 0953 04/25/22 1155  BP: 117/72 123/82 124/72 115/70  Pulse: 99 (!) 114 (!) 107 94  Resp: 19 16  14   Temp: 98.5 F (36.9 C) 99 F (37.2 C)  98.4 F (36.9 C)  TempSrc:  Oral  Oral  SpO2: 91% 100%  99%  Weight:      Height:        Examination:  GENERAL: No apparent distress.  Nontoxic. HEENT: MMM.  Vision and hearing grossly intact.  NECK: Supple.  No apparent JVD.  RESP:  No IWOB.  Fair aeration bilaterally. CVS:  RRR. Heart sounds normal.  ABD/GI/GU: BS+. Abd soft, NTND.  MSK/EXT:  Moves extremities. No apparent deformity. No edema.  SKIN: no apparent  skin lesion or wound NEURO: Awake and alert.  Fairly oriented.  No apparent focal neuro deficit. PSYCH: Calm. Normal affect.   Procedures:  None  Microbiology summarized: COVID-19 and influenza PCR nonreactive. Urine culture and blood culture negative. GIP with Campylobacter species  Assessment and plan: Principal Problem:   Acute on chronic respiratory failure with hypoxia and hypercapnia (HCC) Active Problems:   Acute on chronic diastolic CHF (congestive heart failure) (HCC)   Infectious diarrhea   CAD (coronary artery disease)   Schizophrenia (HCC)   Controlled type 2 diabetes mellitus without complication, without long-term current use of insulin (HCC)   HTN (hypertension)   HLD (hyperlipidemia)   Obesity, Class III, BMI 40-49.9 (morbid obesity) (HCC)   At risk for sleep apnea   Difficulty with activities of daily living   Unsatisfactory living conditions   Paroxysmal SVT (supraventricular tachycardia)   Sinus tachycardia  Acute on chronic hypoxic and hypercarbic respiratory failure: 84% on RA.  Not on oxygen at home.  ABG with hypercapnia with normal pH suggesting chronic obstructive etiology such as OHS/OSA or undiagnosed COPD.  CTA chest with some RUL and RLL opacity but Pro-Cal negative.  -[facility] will follow patient on the outpatient setting for triology/Bipap but I doubt she would wear. -Wean oxygen as able.  Minimum oxygen to keep saturation above 88%. -BiPAP as needed and at night but patient is noncompliant partly due to anxiety   Acute diastolic congestive heart  failure: BNP elevated to 900s.  TTE with LVEF of 55 to 60%, mild cLVH, indeterminate DD, moderate pericardial effusion without tamponade and calcified mitral valve.  Diuresed with IV Lasix and transition to p.o. torsemide.  Net -39 L.  Renal function stable.  Some alkalosis but improving. -Continue torsemide 40 mg daily -Continue Aldactone 25 mg daily -Strict intake and output, daily weight, renal functions  and electrolytes  Acute metabolic encephalopathy: Seems to have resolved.  CT head without acute finding.  No focal neurodeficit.  She has significant hypercapnia but compensated. -Reorientation and delirium precautions -Wean oxygen as above.  Minimum to keep saturation above 88% -Avoid or minimize sedating medications   Campylobacter gastroenteritis: CT without evidence of colitis.  Resolved.   Coronary artery disease: Noted on CT chest.  Complained of chest pain this morning now has resolved.  Troponins normal.  EKG without any findings suggestive of ischemic changes  Sinus tachycardia/paroxysmal SVT: Likely due to sleep apnea.  Refusing to wear BiPAP due to anxiety. -Continue low-dose metoprolol   Fatty liver disease: Right upper quadrant ultrasound showed mildly increased echogenicity throughout the liver.   Uterine abnormality: CT abdomen/pelvis with 1.4 cm enhancing nodule centrally within the uterus could reflect a submucosal fibroid or endometrial polyp.  Pelvic US limited due to morbid obesity but with endometrial thickness considered abnormal for asymptomatic postmenopausal result patient with recommendation of endometrial sampling to exclude carcinoma.   -Outpatient follow-up with GYN.    Controlled NIDDM-2: A1c of 6.5.  Not on medication at home.  CBG within acceptable range. -Discontinued CBG monitoring and SSI   Essential hypertension: Normotensive.  Not on meds at home. -Diuretics as above.   History of schizophrenia: Does not seem to be on antipsychotics.  Depakote and Effexor on home list. -Continue home medication  Hypokalemia:  -Monitor and replenish as appropriate   Debility/deconditioning/poor living situation: found to have infestation with bedbugs, roaches at home.  Unable to take care of herself at baseline.  Family seeking guardianship.  Very poor living condition.  PT/OT recommending SNF on discharge.  TOC following  Morbid obesity Body mass index is 41.82  kg/m.           DVT prophylaxis:  enoxaparin (LOVENOX) injection 40 mg Start: 04/14/22 2200  Code Status: Full code Family Communication: None at bedside. Level of care: Progressive Status is: Inpatient Remains inpatient appropriate because: Safe disposition/SNF   Final disposition: SNF Consultants:  None  Sch Meds:  Scheduled Meds:  divalproex  500 mg Oral QHS   enoxaparin (LOVENOX) injection  40 mg Subcutaneous Q24H   liver oil-zinc oxide   Topical BID   loratadine  10 mg Oral QHS   metoprolol tartrate  12.5 mg Oral BID   nicotine  21 mg Transdermal Daily   spironolactone  25 mg Oral Daily   torsemide  40 mg Oral Daily   venlafaxine XR  75 mg Oral Q breakfast   Continuous Infusions: PRN Meds:.acetaminophen **OR** acetaminophen, albuterol, mouth rinse  Antimicrobials: Anti-infectives (From admission, onward)    Start     Dose/Rate Route Frequency Ordered Stop   04/13/22 1745  ceFEPIme (MAXIPIME) 2 g in sodium chloride 0.9 % 100 mL IVPB        2 g 200 mL/hr over 30 Minutes Intravenous  Once 04/13/22 1733 04/13/22 2219   04/13/22 1745  metroNIDAZOLE (FLAGYL) IVPB 500 mg        500 mg 100 mL/hr over 60 Minutes Intravenous  Once 04/13/22 1733  04/14/22 0652   04/13/22 1745  vancomycin (VANCOCIN) IVPB 1000 mg/200 mL premix        1,000 mg 200 mL/hr over 60 Minutes Intravenous  Once 04/13/22 1733 04/14/22 0653        I have personally reviewed the following labs and images: CBC: Recent Labs  Lab 04/22/22 0402 04/23/22 0414 04/24/22 0353  WBC 5.1 5.7 6.8  HGB 12.7 12.8 13.3  HCT 47.2* 47.4* 48.3*  MCV 86.3 86.2 85.6  PLT 165 172 203   BMP &GFR Recent Labs  Lab 04/20/22 0449 04/21/22 0426 04/22/22 0402 04/23/22 0414 04/24/22 0353  NA 141 139 138 137 137  K 3.3* 3.5 3.5 3.4* 3.6  CL 88* 88* 84* 83* 85*  CO2 >45* 44* 44* >45* 40*  GLUCOSE 83 80 85 97 111*  BUN 14 15 14 16 15   CREATININE 0.37* 0.59 0.48 0.64 0.49  CALCIUM 8.9 9.0 8.9 9.1 9.5   MG  --   --  1.8 1.8 1.9  PHOS  --   --  3.6 3.5 3.8   Estimated Creatinine Clearance: 89.8 mL/min (by C-G formula based on SCr of 0.49 mg/dL). Liver & Pancreas: Recent Labs  Lab 04/22/22 0402 04/23/22 0414 04/24/22 0353  AST 18  --   --   ALT 14  --   --   ALKPHOS 72  --   --   BILITOT 0.7  --   --   PROT 6.6  --   --   ALBUMIN 2.8* 2.8* 3.0*   No results for input(s): "LIPASE", "AMYLASE" in the last 168 hours. No results for input(s): "AMMONIA" in the last 168 hours. Diabetic: No results for input(s): "HGBA1C" in the last 72 hours. Recent Labs  Lab 04/25/22 0748  GLUCAP 139*   Cardiac Enzymes: No results for input(s): "CKTOTAL", "CKMB", "CKMBINDEX", "TROPONINI" in the last 168 hours. No results for input(s): "PROBNP" in the last 8760 hours. Coagulation Profile: No results for input(s): "INR", "PROTIME" in the last 168 hours. Thyroid Function Tests: No results for input(s): "TSH", "T4TOTAL", "FREET4", "T3FREE", "THYROIDAB" in the last 72 hours.  Lipid Profile: No results for input(s): "CHOL", "HDL", "LDLCALC", "TRIG", "CHOLHDL", "LDLDIRECT" in the last 72 hours. Anemia Panel: No results for input(s): "VITAMINB12", "FOLATE", "FERRITIN", "TIBC", "IRON", "RETICCTPCT" in the last 72 hours. Urine analysis:    Component Value Date/Time   COLORURINE YELLOW 04/14/2022 0018   APPEARANCEUR CLEAR 04/14/2022 0018   LABSPEC 1.005 04/14/2022 0018   PHURINE 6.0 04/14/2022 0018   GLUCOSEU NEGATIVE 04/14/2022 0018   HGBUR MODERATE (A) 04/14/2022 0018   BILIRUBINUR NEGATIVE 04/14/2022 0018   KETONESUR NEGATIVE 04/14/2022 0018   PROTEINUR NEGATIVE 04/14/2022 0018   UROBILINOGEN 1.0 11/30/2008 1156   NITRITE NEGATIVE 04/14/2022 0018   LEUKOCYTESUR MODERATE (A) 04/14/2022 0018   Sepsis Labs: Invalid input(s): "PROCALCITONIN", "LACTICIDVEN"  Microbiology: No results found for this or any previous visit (from the past 240 hour(s)).   Radiology Studies: No results  found.    Artemis Koller T. Sharma Ellen Acevedo  If 7PM-7AM, please contact night-coverage [email or web address] 04/25/2022, 1:55 PM

## 2022-04-25 NOTE — Progress Notes (Signed)
Nutrition Brief Note  Patient identified on the Malnutrition Screening Tool (MST) Report  Wt Readings from Last 15 Encounters:  04/25/22 110.5 kg    Body mass index is 41.82 kg/m. Patient meets criteria for obesity based on current BMI.   Current diet order is heat healthy, patient is consuming approximately 100% of meals at this time. Pt reports good appetite, in fact is eating more than usual. Menus reviewed, pt meeting 100% estimated nutrient needs during admission. Discussed heart healthy diet and pt reports she knows about nutrition. Has no nutrition related questions or concerns. She was having difficulty preparing meals and getting around PTA but plans to go to rehab to get stronger before returning home. Labs and medications reviewed.   No nutrition interventions warranted at this time. If nutrition issues arise, please consult RD.   Candise Bowens, MS, RD, LDN, CNSC See AMiON for contact information

## 2022-04-26 DIAGNOSIS — I2510 Atherosclerotic heart disease of native coronary artery without angina pectoris: Secondary | ICD-10-CM

## 2022-04-26 LAB — MAGNESIUM: Magnesium: 2.1 mg/dL (ref 1.7–2.4)

## 2022-04-26 LAB — RENAL FUNCTION PANEL
Albumin: 2.9 g/dL — ABNORMAL LOW (ref 3.5–5.0)
Anion gap: 9 (ref 5–15)
BUN: 14 mg/dL (ref 8–23)
CO2: 38 mmol/L — ABNORMAL HIGH (ref 22–32)
Calcium: 9.4 mg/dL (ref 8.9–10.3)
Chloride: 89 mmol/L — ABNORMAL LOW (ref 98–111)
Creatinine, Ser: 0.51 mg/dL (ref 0.44–1.00)
GFR, Estimated: >60 mL/min (ref >60)
Glucose, Bld: 134 mg/dL — ABNORMAL HIGH (ref 70–99)
Phosphorus: 3.8 mg/dL (ref 2.5–4.6)
Potassium: 3.3 mmol/L — ABNORMAL LOW (ref 3.5–5.1)
Sodium: 136 mmol/L (ref 135–145)

## 2022-04-26 LAB — BRAIN NATRIURETIC PEPTIDE: B Natriuretic Peptide: 192 pg/mL — ABNORMAL HIGH (ref 0.0–100.0)

## 2022-04-26 MED ORDER — ACETAMINOPHEN 325 MG PO TABS: 650.0000 mg | ORAL_TABLET | Freq: Four times a day (QID) | ORAL | Status: AC | PRN

## 2022-04-26 MED ORDER — TORSEMIDE 40 MG PO TABS: 40.0000 mg | ORAL_TABLET | Freq: Every day | ORAL | Status: AC

## 2022-04-26 MED ORDER — LORATADINE 10 MG PO TABS: 10.0000 mg | ORAL_TABLET | Freq: Every day | ORAL | Status: AC

## 2022-04-26 MED ORDER — SPIRONOLACTONE 25 MG PO TABS: 25.0000 mg | ORAL_TABLET | Freq: Every day | ORAL | Status: AC

## 2022-04-26 MED ORDER — POTASSIUM CHLORIDE CRYS ER 20 MEQ PO TBCR
40.0000 meq | EXTENDED_RELEASE_TABLET | ORAL | Status: AC
  Administered 2022-04-26 (×2): 40 meq via ORAL
  Filled 2022-04-26 (×2): qty 2

## 2022-04-26 MED ORDER — POTASSIUM CHLORIDE CRYS ER 20 MEQ PO TBCR: 20.0000 meq | EXTENDED_RELEASE_TABLET | Freq: Every day | ORAL | Status: AC

## 2022-04-26 MED ORDER — DAPAGLIFLOZIN PROPANEDIOL 10 MG PO TABS: 10.0000 mg | ORAL_TABLET | Freq: Every day | ORAL | Status: AC

## 2022-04-26 MED ORDER — METOPROLOL TARTRATE 25 MG PO TABS: 12.5000 mg | ORAL_TABLET | Freq: Two times a day (BID) | ORAL | Status: AC

## 2022-04-26 NOTE — TOC Transition Note (Addendum)
Transition of Care [facility]) - CM/SW Discharge Note   Patient Details  Name: Ellen Acevedo MRN: [id number] Date of Birth: 12-04-1960  Transition of Care [facility]) CM/SW Contact:  Roseanne Kaufman, RN Phone Number: 04/26/2022, 1:25 PM   Clinical Narrative:  Patient to transfer to [facility]) SNF, report can be called [phone], room # A11-2, notified MD, RN.  Patient, sister [name] have been notified, [facility] packet at desk, [facility] will call [facility] for transport.   TOC will continue to follow   - 2:04p PTAR has been called. Discharge summary, SNF transfer report sent to SNF in [place], notified Debbie. Patient can be received at the SNF at anytime, preference by 8p,   TOC will continue to follow.     Barriers to Discharge: Continued Medical Work up   Patient Goals and CMS Choice Patient states their goals for this hospitalization and ongoing recovery are:: rehab at [facility] CMS Medicare.gov Compare Post Acute Care list provided to:: Patient Choice offered to / list presented to : Patient  Discharge Placement                       Discharge Plan and Services In-house Referral: NA Discharge Planning Services: CM Consult Post Acute Care Choice: NA          DME Arranged: N/A DME Agency: NA       HH Arranged: NA HH Agency: NA        Social Determinants of Health (SDOH) Interventions     Readmission Risk Interventions     No data to display

## 2022-04-26 NOTE — Discharge Summary (Signed)
Physician Discharge Summary  Ellen Acevedo XVQ:[id number] DOB: 1960-11-15 DOA: 04/13/2022  PCP: Nolene Ebbs, MD  Admit date: 04/13/2022 Discharge date: 04/26/2022 Admitted From: Home Disposition: SNF Recommendations for Outpatient Follow-up:  Check fluid status, renal functions, electrolytes and CBC in 1 week. Recommend minimum oxygen to keep saturation above 88%.  Patient is high risk for CO2 retention. Please follow up on the following pending results: None   Discharge Condition: Stable CODE STATUS: Full code  Contact information for after-discharge care     [facility] .   Service: Skilled Nursing Contact information: [address] [place] [facility] course 61 year old F with PMH of schizophrenia, DM-2, morbid obesity, HTN, tobacco use disorder and HLD presenting with diarrhea, generalized abdominal pain, confusion, cough, shortness of breath, and admitted for acute respiratory failure with hypoxia and hypercapnia and acute on chronic diastolic CHF.  ABG with hypercapnia to 90 but normal pH.  BNP elevated to 970.  CTA chest negative for PE CAD and bilateral effusions with RUL and RLL airspace opacities.  CT abdomen with heterogeneous opacity throughout the liver and anasarca.  TTE with normal LVEF but calcified mitral valve.  GIP positive for Campylobacter.  Patient was treated with BiPAP and IV Lasix.  Symptoms improved.  Transition to oral diuretics and continued to diurese well.  She had net -40 L.  Her weight went down from 312 pounds to 234 pounds.  Unfortunately, patient was not able to continue using BiPAP due to claustrophobia.  However, she was able to maintain saturation in the lower to mid 90s 1 to 2 L by nasal cannula.  We strongly recommend minimum oxygen to keep saturation above 88% due to risk of CO2 retention.  Therapy recommended SNF.  See  individual problem list below for more.   Problems addressed during this hospitalization Principal Problem:   Acute on chronic respiratory failure with hypoxia and hypercapnia (HCC) Active Problems:   Acute on chronic diastolic CHF (congestive heart failure) (HCC)   Infectious diarrhea   CAD (coronary artery disease)   Schizophrenia (HCC)   Controlled type 2 diabetes mellitus without complication, without long-term current use of insulin (HCC)   HTN (hypertension)   HLD (hyperlipidemia)   Obesity, Class III, BMI 40-49.9 (morbid obesity) ([place])   At risk for sleep apnea   Difficulty with activities of daily living   Unsatisfactory living conditions   Paroxysmal SVT (supraventricular tachycardia)   Sinus tachycardia   Acute on chronic hypoxic and hypercarbic respiratory failure: 84% on RA.  Not on oxygen at home.  ABG with hypercapnia with normal pH suggesting chronic obstructive etiology such as OHS/OSA or undiagnosed COPD.  CTA chest with some RUL and RLL opacity but Pro-Cal negative.  -ADAPT to follow outpatient for triology/Bipap but patient did not tolerate BiPAP due to claustrophobia -Minimum oxygen to keep saturation above 88% due to risk for CO2 retention.  She has been able to sustain saturation and lower 90s on 1 to 2 L.    Acute diastolic congestive heart failure: BNP elevated to 900s.  TTE with LVEF of 55 to 60%, mild cLVH, indeterminate DD, moderate pericardial effusion without tamponade and calcified mitral valve.  Diuresed with IV Lasix and transition to p.o. torsemide.  Net -40 L.  Weight down from 312 pounds to 234 pounds.  Renal function stable.  Some alkalosis but improving. -Continue torsemide 40 mg daily -Continue Aldactone 25 mg daily -P.o. KCl 20 mEq daily -Added Farxiga 10 mg daily. -Assess fluid status, renal functions and electrolytes in 1 week. -Sodium restriction to less than 2 g and fluid restriction to less than 1500 cc a day   Acute metabolic  encephalopathy: Seems to have resolved.  CT head without acute finding.  No focal neurodeficit.  She has significant hypercapnia but compensated. -Reorientation and delirium precautions -Wean oxygen as above.  Minimum to keep saturation above 88% -Avoid or minimize sedating medications   Campylobacter gastroenteritis: CT without evidence of colitis.  Resolved.   Coronary artery disease: Noted on CT chest.  Complained of chest pain this morning now has resolved.  Troponins normal.  EKG without any findings suggestive of ischemic changes   Sinus tachycardia/paroxysmal SVT: Likely due to sleep apnea.  Refusing to wear BiPAP due to anxiety/claustrophobia. -Continue low-dose metoprolol   Fatty liver disease: Right upper quadrant ultrasound showed mildly increased echogenicity throughout the liver.   Uterine abnormality: CT abdomen/pelvis with 1.4 cm enhancing nodule centrally within the uterus could reflect a submucosal fibroid or endometrial polyp.  Pelvic US limited due to morbid obesity but with endometrial thickness considered abnormal for asymptomatic postmenopausal result patient with recommendation of endometrial sampling to exclude carcinoma.   -Outpatient follow-up with GYN.    Controlled NIDDM-2: A1c of 6.5%.  Not on medication at home.  CBG within acceptable range. -Added Farxiga 10 mg daily.   Essential hypertension: Normotensive.  Not on meds at home. -Diuretics and beta-blocker as above   History of schizophrenia-unspecified: Does not seem to be on antipsychotics.  Depakote and Effexor on home list. -Continue home Depakote   Hypokalemia: K3.3. -Received p.o. KCl 40x2 prior to discharge. -P.o. KCl and Aldactone on discharge -Recheck BMP and electrolytes in 1 week   Debility/deconditioning/poor living situation: found to have infestation with bedbugs, roaches at home. Unable to take care of herself at baseline.  Family seeking guardianship.  Very poor living condition.  PT/OT  recommending SNF on discharge.    Morbid obesity Body mass index is 40.23 kg/m.            Vital signs Vitals:   04/25/22 2044 04/26/22 0330 04/26/22 0331 04/26/22 0917  BP: 134/81  118/82   Pulse: (!) 105  96 (!) 105  Temp: 98.7 F (37.1 C)  98.2 F (36.8 C)   Resp: 20  18   Height:      Weight:  106.3 kg    SpO2: 93%  93% 94%  TempSrc: Oral  Oral   BMI (Calculated):  40.21       Discharge exam  GENERAL: No apparent distress.  Nontoxic. HEENT: MMM.  Vision and hearing grossly intact.  NECK: Supple.  No apparent JVD.  RESP:  No IWOB.  Fair aeration bilaterally. CVS:  RRR. Heart sounds normal.  ABD/GI/GU: BS+. Abd soft, NTND.  MSK/EXT:  Moves extremities but awake. No apparent deformity. No edema.  SKIN: no apparent skin lesion or wound NEURO: Awake and alert. Oriented appropriately.  No apparent focal neuro deficit. PSYCH: Calm. Normal affect.   Discharge Instructions Discharge Instructions     Diet - low sodium heart healthy   Complete by: As directed    Increase activity slowly   Complete by: As directed    No wound care   Complete by: As directed       Allergies as of  04/26/2022       Reactions   Trilafon [perphenazine] Rash, Other (See Comments)   Per family this allergy is questionable         Medication List     STOP taking these medications    albuterol 108 (90 Base) MCG/ACT inhaler Commonly known as: VENTOLIN HFA   cetirizine 10 MG tablet Commonly known as: ZYRTEC   diphenhydrAMINE 25 MG tablet Commonly known as: BENADRYL   nicotine polacrilex 2 MG gum Commonly known as: NICORETTE       TAKE these medications    acetaminophen 325 MG tablet Commonly known as: TYLENOL Take 2 tablets (650 mg total) by mouth every 6 (six) hours as needed for mild pain (or Fever >/= 101).   dapagliflozin propanediol 10 MG Tabs tablet Commonly known as: Farxiga Take 1 tablet (10 mg total) by mouth daily before breakfast.   divalproex 500 MG  DR tablet Commonly known as: DEPAKOTE Take 500 mg by mouth at bedtime.   loratadine 10 MG tablet Commonly known as: CLARITIN Take 1 tablet (10 mg total) by mouth at bedtime.   metoprolol tartrate 25 MG tablet Commonly known as: LOPRESSOR Take 0.5 tablets (12.5 mg total) by mouth 2 (two) times daily.   potassium chloride SA 20 MEQ tablet Commonly known as: KLOR-CON M Take 1 tablet (20 mEq total) by mouth daily.   spironolactone 25 MG tablet Commonly known as: ALDACTONE Take 1 tablet (25 mg total) by mouth daily. Start taking on: April 27, 2022   Torsemide 40 MG Tabs Take 40 mg by mouth daily. Start taking on: April 27, 2022   venlafaxine XR 75 MG 24 hr capsule Commonly known as: EFFEXOR-XR Take 75 mg by mouth daily with breakfast.        Consultations: None  Procedures/Studies:   ECHOCARDIOGRAM COMPLETE  Result Date: 04/15/2022    ECHOCARDIOGRAM REPORT   Patient Name:   LASYA VETTER Date of Exam: 04/15/2022 Medical Rec #:  [id number]       Height:       64.0 in Accession #:    [id number]      Weight:       312.2 lb Date of Birth:  1961/06/17       BSA:          2.364 m Patient Age:    61 years        BP:           135/78 mmHg Patient Gender: F               HR:           110 bpm. Exam Location:  Inpatient Procedure: 2D Echo, 3D Echo, Cardiac Doppler and Color Doppler Indications:    CHF-Acute Systolic V81.84  History:        Patient has no prior history of Echocardiogram examinations.                 CAD, Signs/Symptoms:Dyspnea; Risk Factors:Hypertension, Diabetes                 and Dyslipidemia. Acute hypoxic/hypercapnic respite failure.                 Acute congestive heart failure exacerbation.                 Acute metabolic encephalopathy.  Sonographer:    Darlina Sicilian RDCS Referring Phys: [id number] [place]  1. Left ventricular ejection fraction, by estimation, is  55 to 60%. The left ventricle has normal function. The left ventricle has no  regional wall motion abnormalities. There is mild concentric left ventricular hypertrophy. Diastolic function indeterminant due to moderate-to-severe MAC. Elevated left atrial pressure.  2. Right ventricular systolic function is normal. The right ventricular size is mildly enlarged. There is mildly elevated pulmonary artery systolic pressure. The estimated right ventricular systolic pressure is 79.3 mmHg.  3. There is a moderate sized pericardial effusion present. The effusion is circumferential but appears largest around the RV. The IVC is dilated and does not collapse >50% with sniff but there is no RV diastolic collapse or significant respiratory variation across the MV to suggest tamponade.  4. The mitral valve is degenerative. Trivial mitral valve regurgitation. Moderate to severe mitral annular calcification.  5. The aortic valve is tricuspid. Aortic valve regurgitation is not visualized. Aortic valve sclerosis is present, with no evidence of aortic valve stenosis.  6. The inferior vena cava is dilated in size with <50% respiratory variability, suggesting right atrial pressure of 15 mmHg. Comparison(s): No prior Echocardiogram. FINDINGS  Left Ventricle: Left ventricular ejection fraction, by estimation, is 55 to 60%. The left ventricle has normal function. The left ventricle has no regional wall motion abnormalities. 3D left ventricular ejection fraction analysis performed but not reported based on interpreter judgement due to suboptimal tracking. The left ventricular internal cavity size was normal in size. There is mild concentric left ventricular hypertrophy. Diastolic function indeterminant due to moderate-to-severe MAC. Elevated left atrial pressure. Right Ventricle: The right ventricular size is mildly enlarged. No increase in right ventricular wall thickness. Right ventricular systolic function is normal. There is mildly elevated pulmonary artery systolic pressure. The tricuspid regurgitant velocity is  2.40 m/s, and with an assumed right atrial pressure of 15 mmHg, the estimated right ventricular systolic pressure is 90.3 mmHg. Left Atrium: Left atrial size was normal in size. Right Atrium: Right atrial size was normal in size. Pericardium: There is a moderate sized pericardial effusion present. The effusion is circumferential but appears largest around the RV. The IVC is dilated and does not collapse >50% with sniff but there is no RV diastolic collapse or significant respiratory variation across the MV to suggest tamponade. Mitral Valve: The mitral valve is degenerative in appearance. There is mild thickening of the mitral valve leaflet(s). There is mild calcification of the mitral valve leaflet(s). Moderate to severe mitral annular calcification. Trivial mitral valve regurgitation. MV peak gradient, 13.1 mmHg. The mean mitral valve gradient is 6.0 mmHg. Tricuspid Valve: The tricuspid valve is normal in structure. Tricuspid valve regurgitation is trivial. Aortic Valve: The aortic valve is tricuspid. Aortic valve regurgitation is not visualized. Aortic valve sclerosis is present, with no evidence of aortic valve stenosis. Pulmonic Valve: The pulmonic valve was normal in structure. Pulmonic valve regurgitation is trivial. Aorta: The aortic root and ascending aorta are structurally normal, with no evidence of dilitation. Venous: The inferior vena cava is dilated in size with less than 50% respiratory variability, suggesting right atrial pressure of 15 mmHg. IAS/Shunts: The atrial septum is grossly normal.  LEFT VENTRICLE PLAX 2D LVIDd:         4.40 cm   Diastology LVIDs:         3.00 cm   LV e' medial:    6.85 cm/s LV PW:         1.20 cm   LV E/e' medial:  18.4 LV IVS:        1.10 cm  LV e' lateral:   5.87 cm/s LVOT diam:     2.30 cm   LV E/e' lateral: 21.5 LV SV:         100 LV SV Index:   42 LVOT Area:     4.15 cm                           3D Volume EF:                          3D EF:        54 %                           LV EDV:       164 ml                          LV ESV:       75 ml                          LV SV:        89 ml RIGHT VENTRICLE RV Basal diam:  5.00 cm RV Mid diam:    3.60 cm RV S prime:     17.40 cm/s TAPSE (M-mode): 2.1 cm LEFT ATRIUM             Index        RIGHT ATRIUM           Index LA diam:        3.40 cm 1.44 cm/m   RA Area:     19.40 cm LA Vol (A2C):   39.8 ml 16.84 ml/m  RA Volume:   54.00 ml  22.84 ml/m LA Vol (A4C):   53.9 ml 22.80 ml/m LA Biplane Vol: 48.4 ml 20.47 ml/m  AORTIC VALVE LVOT Vmax:   159.00 cm/s LVOT Vmean:  109.000 cm/s LVOT VTI:    0.240 m  AORTA Ao Root diam: 3.30 cm Ao Asc diam:  3.30 cm MITRAL VALVE                TRICUSPID VALVE MV Area (PHT): 3.01 cm     TR Peak grad:   23.0 mmHg MV Area VTI:   3.56 cm     TR Vmax:        240.00 cm/s MV Peak grad:  13.1 mmHg MV Mean grad:  6.0 mmHg     SHUNTS MV Vmax:       1.81 m/s     Systemic VTI:  0.24 m MV Vmean:      117.0 cm/s   Systemic Diam: 2.30 cm MV Decel Time: 252 msec MV E velocity: 126.00 cm/s MV A velocity: 179.00 cm/s MV E/A ratio:  0.70 Gwyndolyn Kaufman MD Electronically signed by Gwyndolyn Kaufman MD Signature Date/Time: 04/15/2022/3:49:24 PM    Final    US PELVIC COMPLETE WITH TRANSVAGINAL  Result Date: 04/14/2022 CLINICAL DATA:  61 year old female history of enhancing lesion in the uterus on prior CT examination. Follow-up study. EXAM: TRANSABDOMINAL AND TRANSVAGINAL ULTRASOUND OF PELVIS DOPPLER ULTRASOUND OF OVARIES TECHNIQUE: Both transabdominal and transvaginal ultrasound examinations of the pelvis were performed. Transabdominal technique was performed for global imaging of the pelvis including uterus, ovaries, adnexal regions, and pelvic cul-de-sac. It was necessary to proceed with endovaginal exam following the  transabdominal exam to visualize the adnexal regions. Color and duplex Doppler ultrasound was utilized to evaluate blood flow to the ovaries. COMPARISON:  Pelvic ultrasound 10/09/2004.  FINDINGS: Comment: Today's study was severely limited by patient's morbid obesity. Uterus Measurements: 10.7 x 4.3 x 4.5 cm = volume: 108.8 mL. No fibroids or other mass visualized. Endometrium Thickness: 13 mm. Poorly demonstrated on today's suboptimal examination. Right ovary Could not be visualized. Left ovary Could not be visualized. Other findings Trace volume of free fluid in the cul-de-sac. IMPRESSION: 1. Exceedingly limited examination secondary to patient's morbid obesity. Endometrial thickness is considered abnormal for an asymptomatic post-menopausal female. Endometrial sampling should be considered to exclude carcinoma. Electronically Signed   By: Vinnie Langton M.D.   On: 04/14/2022 08:57   US Abdomen Limited RUQ (LIVER/GB)  Result Date: 04/14/2022 CLINICAL DATA:  Abnormal CT scan.  Possible cirrhosis. EXAM: ULTRASOUND ABDOMEN LIMITED RIGHT UPPER QUADRANT COMPARISON:  CT abdomen and pelvis 04/14/22 at 12:03 a.m. FINDINGS: Gallbladder: The gallbladder is not distended. Wall thickness measures 2 mm. Pericholecystic fluid is noted. Common bile duct: Diameter: 3.0 mm, within normal limits. Liver: Mild increased echogenicity is present throughout the liver. No discrete lesions are present. Portal vein is patent on color Doppler imaging with normal direction of blood flow towards the liver. Other: Right pleural effusion noted. IMPRESSION: 1. Mild increased echogenicity throughout the liver without focal lesion. This suggests hepatic steatosis. 2. Pericholecystic fluid without evidence for cholelithiasis. Gallbladder is not distended. Fluid likely related to overall anasarca. 3. Right pleural effusion. Electronically Signed   By: San Morelle M.D.   On: 04/14/2022 08:37   CT Angio Chest PE W/Cm &/Or Wo Cm  Result Date: 04/14/2022 CLINICAL DATA:  Altered mental status.  Shortness of breath. EXAM: CT ANGIOGRAPHY CHEST WITH CONTRAST TECHNIQUE: Multidetector CT imaging of the chest was  performed using the standard protocol during bolus administration of intravenous contrast. Multiplanar CT image reconstructions and MIPs were obtained to evaluate the vascular anatomy. RADIATION DOSE REDUCTION: This exam was performed according to the departmental dose-optimization program which includes automated exposure control, adjustment of the mA and/or kV according to patient size and/or use of iterative reconstruction technique. CONTRAST:  137m OMNIPAQUE IOHEXOL 350 MG/ML SOLN COMPARISON:  None Available. FINDINGS: Cardiovascular: No filling defects in the pulmonary arteries to suggest pulmonary emboli. Mild cardiomegaly. Small pericardial effusion. Scattered coronary artery and aortic calcifications. No evidence of aortic aneurysm. Mediastinum/Nodes: No mediastinal, hilar, or axillary adenopathy. Trachea and esophagus are unremarkable. Thyroid unremarkable. Lungs/Pleura: Moderate right pleural effusion and small left pleural effusion. Dependent airspace disease in the right upper lobe and consolidation in the right lower lobe. This could reflect pneumonia or compressive atelectasis. Left base atelectasis. Upper Abdomen: See abdominal CT report Musculoskeletal: Chest wall soft tissues are unremarkable. No acute bony abnormality. Review of the MIP images confirms the above findings. IMPRESSION: No evidence of pulmonary embolus. Coronary artery disease, mild cardiomegaly. Bilateral effusions, right greater than left. Right upper lobe and right lower lobe airspace opacities could reflect pneumonia or compressive atelectasis. Aortic Atherosclerosis (ICD10-I70.0). Electronically Signed   By: KRolm BaptiseM.D.   On: 04/14/2022 00:48   CT Abdomen Pelvis W Contrast  Result Date: 04/14/2022 CLINICAL DATA:  Abdominal pain EXAM: CT ABDOMEN AND PELVIS WITH CONTRAST TECHNIQUE: Multidetector CT imaging of the abdomen and pelvis was performed using the standard protocol following bolus administration of intravenous  contrast. RADIATION DOSE REDUCTION: This exam was performed according to the departmental dose-optimization program which includes  automated exposure control, adjustment of the mA and/or kV according to patient size and/or use of iterative reconstruction technique. CONTRAST:  172m OMNIPAQUE IOHEXOL 350 MG/ML SOLN COMPARISON:  None Available. FINDINGS: Lower chest: Small right pleural effusion. Right lower lobe atelectasis or infiltrate. Minimal left base atelectasis. Small pericardial effusion. Hepatobiliary: Heterogeneous enhancement of the liver. Pericholecystic fluid noted. No visible stones or biliary ductal dilatation. Pancreas: No focal abnormality or ductal dilatation. Spleen: No focal abnormality.  Normal size. Adrenals/Urinary Tract: 2.5 cm cyst in the upper pole of the right kidney. No follow-up imaging recommended. No stones or hydronephrosis. Adrenal glands and urinary bladder unremarkable. Stomach/Bowel: Stomach, large and small bowel grossly unremarkable. Vascular/Lymphatic: Aortic atherosclerosis. No evidence of aneurysm or adenopathy. Reproductive: Enhanced seen area centrally within the uterus measures 1.4 cm. This could reflect a submucosal fibroid. Cannot exclude endometrial polyp or other endometrial lesion. No adnexal mass. Other: No free fluid or free air. Diffuse edema throughout the abdominal wall compatible with anasarca. Musculoskeletal: No acute bony abnormality. IMPRESSION: Heterogeneous enhancement throughout the liver. Recommend correlation with LFTs to exclude cirrhosis or hepatitis. Small right pleural effusion. Right lower lobe atelectasis or infiltrate. 1.4 cm enhanced seen nodule centrally within the uterus which could reflect a submucosal fibroid or endometrial polyp. Consider further evaluation with elective pelvic ultrasound. Diffuse anasarca throughout the abdominal wall. Aortic atherosclerosis. Electronically Signed   By: KRolm BaptiseM.D.   On: 04/14/2022 00:45   CT Head  Wo Contrast  Result Date: 04/14/2022 CLINICAL DATA:  Altered mental status EXAM: CT HEAD WITHOUT CONTRAST TECHNIQUE: Contiguous axial images were obtained from the base of the skull through the vertex without intravenous contrast. RADIATION DOSE REDUCTION: This exam was performed according to the departmental dose-optimization program which includes automated exposure control, adjustment of the mA and/or kV according to patient size and/or use of iterative reconstruction technique. COMPARISON:  None Available. FINDINGS: Brain: No acute intracranial abnormality. Specifically, no hemorrhage, hydrocephalus, mass lesion, acute infarction, or significant intracranial injury. Vascular: No hyperdense vessel or unexpected calcification. Skull: No acute calvarial abnormality. Sinuses/Orbits: No acute findings Other: None IMPRESSION: No acute intracranial abnormality. Electronically Signed   By: KRolm BaptiseM.D.   On: 04/14/2022 00:41   DG Chest Port 1 View  Result Date: 04/13/2022 CLINICAL DATA:  Cough. EXAM: PORTABLE CHEST 1 VIEW COMPARISON:  None Available. FINDINGS: The heart is enlarged and the mediastinal contour is within normal limits. The pulmonary vasculature is distended. Atherosclerotic calcification of the aorta is noted. Hazy opacities are noted in the mid to lower lung field on the right. The left lung is clear. No pneumothorax. No acute osseous abnormality. IMPRESSION: 1. Cardiomegaly with pulmonary vascular congestion. 2. Hazy opacities in the mid to lower lung field on the right, possible layering pleural effusion versus edema or infiltrate. Electronically Signed   By: LBrett FairyM.D.   On: 04/13/2022 21:37       The results of significant diagnostics from this hospitalization (including imaging, microbiology, ancillary and laboratory) are listed below for reference.     Microbiology: No results found for this or any previous visit (from the past 240 hour(s)).   Labs:  CBC: Recent  Labs  Lab 04/22/22 0402 04/23/22 0414 04/24/22 0353  WBC 5.1 5.7 6.8  HGB 12.7 12.8 13.3  HCT 47.2* 47.4* 48.3*  MCV 86.3 86.2 85.6  PLT 165 172 203   BMP &GFR Recent Labs  Lab 04/21/22 0426 04/22/22 0402 04/23/22 0414 04/24/22 0353 04/26/22 0414  NA  139 138 137 137 136  K 3.5 3.5 3.4* 3.6 3.3*  CL 88* 84* 83* 85* 89*  CO2 44* 44* >45* 40* 38*  GLUCOSE 80 85 97 111* 134*  BUN _0 CREATININE 0.59 0.48 0.64 0.49 0.51  CALCIUM 9.0 8.9 9.1 9.5 9.4  MG  --  1.8 1.8 1.9 2.1  PHOS  --  3.6 3.5 3.8 3.8   Estimated Creatinine Clearance: 87.8 mL/min (by C-G formula based on SCr of 0.51 mg/dL). Liver & Pancreas: Recent Labs  Lab 04/22/22 0402 04/23/22 0414 04/24/22 0353 04/26/22 0414  AST 18  --   --   --   ALT 14  --   --   --   ALKPHOS 72  --   --   --   BILITOT 0.7  --   --   --   PROT 6.6  --   --   --   ALBUMIN 2.8* 2.8* 3.0* 2.9*   No results for input(s): "LIPASE", "AMYLASE" in the last 168 hours. No results for input(s): "AMMONIA" in the last 168 hours. Diabetic: No results for input(s): "HGBA1C" in the last 72 hours. Recent Labs  Lab 04/25/22 0748 04/25/22 1744  GLUCAP 139* 237*   Cardiac Enzymes: No results for input(s): "CKTOTAL", "CKMB", "CKMBINDEX", "TROPONINI" in the last 168 hours. No results for input(s): "PROBNP" in the last 8760 hours. Coagulation Profile: No results for input(s): "INR", "PROTIME" in the last 168 hours. Thyroid Function Tests: No results for input(s): "TSH", "T4TOTAL", "FREET4", "T3FREE", "THYROIDAB" in the last 72 hours. Lipid Profile: No results for input(s): "CHOL", "HDL", "LDLCALC", "TRIG", "CHOLHDL", "LDLDIRECT" in the last 72 hours. Anemia Panel: No results for input(s): "VITAMINB12", "FOLATE", "FERRITIN", "TIBC", "IRON", "RETICCTPCT" in the last 72 hours. Urine analysis:    Component Value Date/Time   COLORURINE YELLOW 04/14/2022 0018   APPEARANCEUR CLEAR 04/14/2022 0018   LABSPEC 1.005 04/14/2022 0018    PHURINE 6.0 04/14/2022 0018   GLUCOSEU NEGATIVE 04/14/2022 0018   HGBUR MODERATE (A) 04/14/2022 0018   BILIRUBINUR NEGATIVE 04/14/2022 0018   KETONESUR NEGATIVE 04/14/2022 0018   PROTEINUR NEGATIVE 04/14/2022 0018   UROBILINOGEN 1.0 11/30/2008 1156   NITRITE NEGATIVE 04/14/2022 0018   LEUKOCYTESUR MODERATE (A) 04/14/2022 0018   Sepsis Labs: Invalid input(s): "PROCALCITONIN", "LACTICIDVEN"   SIGNED:  Mercy Riding, MD  [facility] 04/26/2022, 1:46 PM

## 2022-04-26 NOTE — Progress Notes (Signed)
Physical Therapy Treatment Patient Details Name: Ellen Acevedo MRN: [id number] DOB: Jun 24, 1961 Today's Date: 04/26/2022   History of Present Illness Ellen Acevedo is a 61 y.o. female with class III obesity and no documented past medical history presented to the ED via EMS from home for evaluation of diarrhea, confusion, cough, and shortness of breath.  [employer] was called for wellness check and patient found at home with bedbugs and roaches.  SPO2 77% on room air with EMS, improved with 4 L O2.  Vital signs on arrival to the ED: Temperature 98.1 F, heart rate 106, respiratory rate 23, blood pressure 159/101, SPO2 100% on 4 L O2.  Labs showing no leukocytosis, lactic acid 2.2 > 0.7, blood cultures drawn, troponin 19> 18, BNP 973, COVID and influenza PCR negative.  UA with negative nitrite, moderate leukocytes, and microscopy showing 0-5 WBCs and no bacteria.  Urine culture pending.  VBG showing pH 7.35, PCO2 80.  She was placed on BiPAP.  CTA chest negative for PE.  Showing aortic atherosclerosis, coronary artery disease, mild cardiomegaly, bilateral pleural effusions, and right upper and lower lobe airspace opacities suspicious for pneumonia versus atelectasis.  CT abdomen pelvis showing heterogeneous enhancement throughout the liver, 1.4 cm nodule within the uterus which could reflect a submucosal fibroid or endometrial polyp.  Pelvic ultrasound recommended for further evaluation.  CT also showing diffuse anasarca throughout the abdominal wall and aortic atherosclerosis.  CT head negative for acute finding. Patient was given vancomycin, cefepime, metronidazole, and 1 L normal saline bolus.     History limited as patient is currently on BiPAP.  He reports diarrhea for the past 3 weeks and mild generalized abdominal pain.  Reports shortness of breath and bilateral lower extremity edema for several months.  Denies chest pain.  She reports medical history of schizophrenia, diabetes, hypertension,  hyperlipidemia, and "heart problems."    PT Comments    Pt received supine in bed reporting some BLE pain but agreeable to mobilize. Pt requested to go to BR for attempt to have a BM, required supervision for bed mobility, min assist for transfers (though progressed to min guard only), and min guard +2 for ambulation in hallway with RW. SpO2 monitored throughout and pt did breifly drop to 86% but with standing rest break and pursed lip breathing recovered quickly. Placed pt on mobility specialist caseload. We will continue to follow acutely.     Recommendations for follow up therapy are one component of a multi-disciplinary discharge planning process, led by the attending physician.  Recommendations may be updated based on patient status, additional functional criteria and insurance authorization.  Follow Up Recommendations  Skilled nursing-short term rehab (<3 hours/day) Can patient physically be transported by private vehicle: Yes   Assistance Recommended at Discharge Intermittent Supervision/Assistance  Patient can return home with the following A little help with walking and/or transfers;A little help with bathing/dressing/bathroom;Assistance with cooking/housework;Direct supervision/assist for medications management;Assist for transportation;Help with stairs or ramp for entrance   Equipment Recommendations  None recommended by PT    Recommendations for Other Services OT consult     Precautions / Restrictions Precautions Precautions: Fall Precaution Comments: monitor O2 Restrictions Weight Bearing Restrictions: No     Mobility  Bed Mobility Overal bed mobility: Needs Assistance Bed Mobility: Supine to Sit, Sit to Supine Rolling: Supervision   Supine to sit: HOB elevated, Min guard     General bed mobility comments: Supervision for safety with HOB elevated, no physical assist required  Transfers Overall transfer level: Needs assistance Equipment used: Rolling walker  (2 wheels) Transfers: Sit to/from Stand Sit to Stand: From elevated surface, Min assist           General transfer comment: Min assist for steadying of RW as pt pulled up on RW rather than pushing up from bed as PT requested, otherwise min guard for safety only. In further attempts pt min guard only for safety.    Ambulation/Gait Ambulation/Gait assistance: Min guard, +2 safety/equipment Gait Distance (Feet): 50 Feet Assistive device: Rolling walker (2 wheels) Gait Pattern/deviations: Step-through pattern, Decreased stride length, Trunk flexed Gait velocity: decr     General Gait Details: Pt ambulated with RW and min guard +2 for recliner follow for safety, no physical assist required or overt LOB noted. Ambulated from EOB to BR to attempt BM. Able to perform pericare herself. Ambulated from BR to hallway and back to recliner. SpO2 monitored 86-100% on 1L via  with quick recovery during standing rest breaks and pursed lip breathing. Pt required 3 standing rest breaks with distances as follows 10, 20, 10, 10.   Stairs             Wheelchair Mobility    Modified [place] (Stroke Patients Only)       Balance Overall balance assessment: Needs assistance Sitting-balance support: Feet supported Sitting balance-Leahy Scale: Fair     Standing balance support: Bilateral upper extremity supported, During functional activity, Single extremity supported Standing balance-Leahy Scale: Fair Standing balance comment: able to stand and perform pericare with 1 UE support                            Cognition Arousal/Alertness: Awake/alert Behavior During Therapy: WFL for tasks assessed/performed Overall Cognitive Status: Within Functional Limits for tasks assessed                                          Exercises      General Comments        Pertinent Vitals/Pain Pain Assessment Pain Assessment: 0-10 Pain Score: 7  Pain Location: B LE Pain  Descriptors / Indicators: Tightness, Discomfort Pain Intervention(s): Monitored during session, Repositioned    Home Living                          Prior Function            PT Goals (current goals can now be found in the care plan section) Acute Rehab PT Goals Patient Stated Goal: get apartment cleaned up and be able to move around home better PT Goal Formulation: With patient Time For Goal Achievement: 04/29/22 Potential to Achieve Goals: Good Progress towards PT goals: Progressing toward goals    Frequency    Min 2X/week      PT Plan Current plan remains appropriate    Co-evaluation              AM-PAC PT "6 Clicks" Mobility   Outcome Measure  Help needed turning from your back to your side while in a flat bed without using bedrails?: A Little Help needed moving from lying on your back to sitting on the side of a flat bed without using bedrails?: A Little Help needed moving to and from a bed to a chair (including a wheelchair)?: A Little Help  needed standing up from a chair using your arms (e.g., wheelchair or bedside chair)?: A Little Help needed to walk in hospital room?: A Little Help needed climbing 3-5 steps with a railing? : A Lot 6 Click Score: 17    End of Session Equipment Utilized During Treatment: Gait belt;Oxygen (1L via [place]) Activity Tolerance: No increased pain;Patient tolerated treatment well Patient left: with call bell/phone within reach;in chair;with chair alarm set;with nursing/sitter in room Nurse Communication: Mobility status PT Visit Diagnosis: Unsteadiness on feet (R26.81);Muscle weakness (generalized) (M62.81);Difficulty in walking, not elsewhere classified (R26.2)     Time: 1010-1048 PT Time Calculation (min) (ACUTE ONLY): 38 min  Charges:  $Gait Training: 8-22 mins $Therapeutic Activity: 8-22 mins                     Coolidge Breeze, PT, DPT [facility] Rehabilitation Department Office: [phone] Weekend pager:  [phone]   Coolidge Breeze 04/26/2022, 10:56 AM

## 2022-04-26 NOTE — TOC Progression Note (Addendum)
Transition of Care [facility]) - Progression Note    Patient Details  Name: Ellen Acevedo MRN: [id number] Date of Birth: 11-Mar-1961  Transition [facility] Care [facility]) CM/SW [place], RN Phone Number: 04/26/2022, 11:51 AM  Clinical Narrative:   Patient and family has chosen [facility], left voicemail for [employer] for room # and report #, awaiting discharge summary.   TOC will continue to follow.  - 1:10 pm Spoke with patient at bedside to discuss SNF placement and transportation, patient is in agreement. Awaiting discharge summary, will coordinate PTAR for transport.   TOC will continue to follow.  - Notified Debbie with [facility] that patient is not using bipap. MD confirmed patient no longer using.  Expected Discharge Plan: [place] Barriers to Discharge: Continued Medical Work up  Expected Discharge Plan and Services Expected Discharge Plan: [place] In-house Referral: NA Discharge Planning Services: CM Consult Post Acute Care Choice: NA Living arrangements for the past 2 months: Apartment                 DME Arranged: N/A DME Agency: NA       HH Arranged: NA HH Agency: NA         Social Determinants of Health (SDOH) Interventions    Readmission Risk Interventions     No data to display

## 2022-04-26 NOTE — Progress Notes (Signed)
Pt is being transferred to [facility]. Called to give report to nurse who will receive pt. No answer, left message with documenting RN's phone number, and request to return call.

## 2022-04-26 NOTE — TOC Progression Note (Addendum)
Transition of Care [facility]) - Progression Note    Patient Details  Name: [name] MRN: [id number] Date of Birth: [birth year]  Transition of Care [facility]) CM/SW [place], RN Phone Number: 04/26/2022, 10:17 AM  Clinical Narrative:   Received call from patient's sister [name], to discuss short term SNF bed offers. [name] will discuss with family and call this RNCM back.   TOC will continue to follow.   - 11:30a Spoke with patient's sister PAtricia who reports the patient and family has chosen [facility] in [place], [place]. Notified Whitney with [facility] who will update SNF information.   TOC will continue follow.   Expected Discharge Plan: [place] Barriers to Discharge: Continued Medical Work up  Expected Discharge Plan and Services Expected Discharge Plan: [place] In-house Referral: NA Discharge Planning Services: CM Consult Post Acute Care Choice: NA Living arrangements for the past 2 months: Apartment                 DME Arranged: N/A DME Agency: NA       HH Arranged: NA HH Agency: NA         Social Determinants of Health (SDOH) Interventions    Readmission Risk Interventions     No data to display

## 2022-04-26 NOTE — Plan of Care (Signed)

## 2022-04-26 NOTE — Progress Notes (Signed)
Called report to [facility] Report given to April LPN. Nurse confirmed understanding of all information noted.
